# Patient Record
Sex: Female | Born: 1945
Health system: Southern US, Community
[De-identification: ages and names within clinical notes are randomized; demographics above are authoritative.]

## PROBLEM LIST (undated history)

## (undated) DIAGNOSIS — K2270 Barrett's esophagus without dysplasia: Secondary | ICD-10-CM

## (undated) DIAGNOSIS — Z8614 Personal history of Methicillin resistant Staphylococcus aureus infection: Secondary | ICD-10-CM

## (undated) DIAGNOSIS — R42 Dizziness and giddiness: Secondary | ICD-10-CM

## (undated) DIAGNOSIS — J329 Chronic sinusitis, unspecified: Secondary | ICD-10-CM

## (undated) DIAGNOSIS — D369 Benign neoplasm, unspecified site: Secondary | ICD-10-CM

## (undated) DIAGNOSIS — G43909 Migraine, unspecified, not intractable, without status migrainosus: Secondary | ICD-10-CM

## (undated) DIAGNOSIS — M5134 Other intervertebral disc degeneration, thoracic region: Secondary | ICD-10-CM

## (undated) DIAGNOSIS — H9190 Unspecified hearing loss, unspecified ear: Secondary | ICD-10-CM

## (undated) DIAGNOSIS — M542 Cervicalgia: Secondary | ICD-10-CM

## (undated) DIAGNOSIS — S81801A Unspecified open wound, right lower leg, initial encounter: Secondary | ICD-10-CM

## (undated) DIAGNOSIS — M51369 Other intervertebral disc degeneration, lumbar region without mention of lumbar back pain or lower extremity pain: Secondary | ICD-10-CM

## (undated) DIAGNOSIS — I10 Essential (primary) hypertension: Secondary | ICD-10-CM

## (undated) DIAGNOSIS — E7800 Pure hypercholesterolemia, unspecified: Secondary | ICD-10-CM

## (undated) DIAGNOSIS — M6530 Trigger finger, unspecified finger: Secondary | ICD-10-CM

## (undated) DIAGNOSIS — H9319 Tinnitus, unspecified ear: Secondary | ICD-10-CM

## (undated) DIAGNOSIS — E785 Hyperlipidemia, unspecified: Secondary | ICD-10-CM

## (undated) DIAGNOSIS — K219 Gastro-esophageal reflux disease without esophagitis: Secondary | ICD-10-CM

## (undated) DIAGNOSIS — I8393 Asymptomatic varicose veins of bilateral lower extremities: Secondary | ICD-10-CM

## (undated) HISTORY — DX: Gastro-esophageal reflux disease without esophagitis: K21.9

## (undated) HISTORY — DX: Cervicalgia: M54.2

## (undated) HISTORY — DX: Trigger finger, unspecified finger: M65.30

## (undated) HISTORY — DX: Other intervertebral disc degeneration, thoracic region: M51.34

## (undated) HISTORY — DX: Tinnitus, unspecified ear: H93.19

## (undated) HISTORY — DX: Migraine, unspecified, not intractable, without status migrainosus: G43.909

## (undated) HISTORY — DX: Benign neoplasm, unspecified site: D36.9

## (undated) HISTORY — PX: FINGER SURGERY: SHX640

## (undated) HISTORY — PX: NECK SURGERY: SHX720

## (undated) HISTORY — DX: Pure hypercholesterolemia, unspecified: E78.00

## (undated) HISTORY — DX: Essential (primary) hypertension: I10

## (undated) HISTORY — DX: Hyperlipidemia, unspecified: E78.5

## (undated) HISTORY — DX: Asymptomatic varicose veins of bilateral lower extremities: I83.93

## (undated) HISTORY — DX: Unspecified open wound, right lower leg, initial encounter: S81.801A

## (undated) HISTORY — PX: LAMINECTOMY: SHX219

## (undated) HISTORY — DX: Chronic sinusitis, unspecified: J32.9

## (undated) HISTORY — DX: Personal history of Methicillin resistant Staphylococcus aureus infection: Z86.14

## (undated) HISTORY — PX: APPENDECTOMY: SHX54

## (undated) HISTORY — PX: NASAL SINUS SURGERY: SHX719

## (undated) HISTORY — PX: MYRINGOTOMY: SHX2060

## (undated) HISTORY — DX: Other intervertebral disc degeneration, lumbar region without mention of lumbar back pain or lower extremity pain: M51.369

## (undated) HISTORY — DX: Unspecified hearing loss, unspecified ear: H91.90

## (undated) HISTORY — PX: TRIGGER FINGER RELEASE: SHX641

## (undated) HISTORY — DX: Dizziness and giddiness: R42

## (undated) HISTORY — DX: Barrett's esophagus without dysplasia: K22.70

## (undated) HISTORY — PX: ABDOMINAL HYSTERECTOMY: SHX81

---

## 1998-01-28 ENCOUNTER — Ambulatory Visit (HOSPITAL_COMMUNITY): Admission: RE | Admit: 1998-01-28 | Discharge: 1998-01-28 | Payer: Self-pay | Admitting: Neurosurgery

## 1998-08-21 ENCOUNTER — Other Ambulatory Visit: Admission: RE | Admit: 1998-08-21 | Discharge: 1998-08-21 | Payer: Self-pay | Admitting: Obstetrics and Gynecology

## 1999-09-20 ENCOUNTER — Other Ambulatory Visit: Admission: RE | Admit: 1999-09-20 | Discharge: 1999-09-20 | Payer: Self-pay | Admitting: Obstetrics and Gynecology

## 2001-10-01 ENCOUNTER — Encounter: Payer: Self-pay | Admitting: General Surgery

## 2001-10-01 ENCOUNTER — Ambulatory Visit (HOSPITAL_COMMUNITY): Admission: RE | Admit: 2001-10-01 | Discharge: 2001-10-01 | Payer: Self-pay | Admitting: General Surgery

## 2003-12-29 ENCOUNTER — Encounter (INDEPENDENT_AMBULATORY_CARE_PROVIDER_SITE_OTHER): Payer: Self-pay | Admitting: *Deleted

## 2003-12-29 ENCOUNTER — Ambulatory Visit (HOSPITAL_BASED_OUTPATIENT_CLINIC_OR_DEPARTMENT_OTHER): Admission: RE | Admit: 2003-12-29 | Discharge: 2003-12-29 | Payer: Self-pay | Admitting: *Deleted

## 2003-12-29 ENCOUNTER — Ambulatory Visit (HOSPITAL_COMMUNITY): Admission: RE | Admit: 2003-12-29 | Discharge: 2003-12-29 | Payer: Self-pay | Admitting: *Deleted

## 2009-10-20 ENCOUNTER — Encounter: Admission: RE | Admit: 2009-10-20 | Discharge: 2009-10-20 | Payer: Self-pay | Admitting: Family Medicine

## 2010-09-22 ENCOUNTER — Other Ambulatory Visit: Payer: Self-pay | Admitting: Family Medicine

## 2010-09-22 DIAGNOSIS — Z1231 Encounter for screening mammogram for malignant neoplasm of breast: Secondary | ICD-10-CM

## 2010-10-08 NOTE — Op Note (Signed)
[facility]  Patient:    Nicole Herrera, Nicole Herrera Visit Number: [id number] MRN: [id number]          Service Type: DSU Location: DAY Attending Physician:  Carson Myrtle Dictated by:   Sheppard Plumber Earlene Plater, M.D. Proc. Date: 10/01/01 Admit Date:  10/01/2001   CC:         Stacie Acres. Cliffton Asters, M.D.   Operative Report  PREOPERATIVE DIAGNOSIS:  Anal fissure.  POSTOPERATIVE DIAGNOSIS:  Anal fissure.  PROCEDURE:  Repair of anal fissure.  SURGEON:  Timothy E. Earlene Plater, M.D.  ANESTHESIA:  General.  INDICATION:  [name] has failed conservative management for the past several months for treatment of anal fissure and has elected to proceed with surgical repair and has been carefully discussed and counseled in the office.  Her laboratory data and EKG are normal.  DESCRIPTION OF PROCEDURE:  She was evaluated by anesthesia, taken to the operating room, placed supine, LMA anesthesia provided.  Perianal area inspected and prepped and draped in the usual fashion.  The anus was small, moderately stenotic, had posterior anal fissure, and two tiny skin tags were present.  The anus was gently dilated.  Rectal tags could be felt as well internally.  The anus was injected around and about with Marcaine 0.5% with epinephrine mixed 9:1 with Wydase and gently massaged in.  A left posterior internal sphincterotomy accomplished percutaneously with a 15 blade.  The external sphincter remained intact.  Gentle dilatation allowed insertion of the adult operating anoscope.  The anal papillae internally were cauterized. The fissure base was cauterized, and the two external tags were cauterized. This completed the procedure.  There were no other findings.  The patient tolerated it well.  Gelfoam, gauze, and a dry sterile dressing applied.  She was removed to the recovery room in good condition.  Written and verbal instructions, including Vicodin #36, 1 refill, and she will seen and  followed as an outpatient. Dictated by:   Sheppard Plumber Earlene Plater, M.D. Attending Physician:  Carson Myrtle DD:  10/01/01 TD:  10/01/01 Job: [id number]

## 2010-10-08 NOTE — Op Note (Signed)
NAME:  ORALEE, RAPAPORT                           ACCOUNT NO.:  [id number]   MEDICAL RECORD NO.:  [id number]                   PATIENT TYPE:  AMB   LOCATION:  DSC                                  FACILITY:  [facility]   PHYSICIAN:  Kathy Breach, M.D.                   DATE OF BIRTH:  Dec 14, 1945   DATE OF PROCEDURE:  12/29/2003  DATE OF DISCHARGE:                                 OPERATIVE REPORT   PREOPERATIVE DIAGNOSIS:  Recurrent nasal/sinus polyps.   PROCEDURE:  Revision endoscopic nasal sinus polypectomy including:  1. Completion of anterior ethmoidectomy and frontalotomy, right side.  2. Completion of posterior ethmoidectomy and sphenoidotomy, left side.   POSTOPERATIVE DIAGNOSIS:  Recurrent nasal/sinus polyps.   SURGEON:  Kathy Breach, M.D.   DESCRIPTION OF PROCEDURE:  With the patient under general orotracheal  anesthesia, a nasal block was applied with 4% Xylocaine with Ephedrine  solution soaked cotton tipped probes to the sphenopalatine and anterior  ethmoid nerve areas bilaterally.  Cotton pledgets soaked in a similar  solution were inserted along the middle and inferior turbinate areas  bilaterally.  The middle meatal mucosa was infiltrated with 1% Xylocaine  1:000,000 epinephrine for further vasoconstrictive effort.  On inspection on  the left side, the patient had small polypoid disease in the remaining  ethmoidectomy surgical site with a sizeable polyp hanging over to the medial  side to the posterior extent of the middle turbinate which was quite  hyperplastic and this was consistent with the CAT scan findings showing  significant posterior ethmoid disease and polypoid degeneration of the  anterior sphenoid sinus.  On the right side, she had a sizeable mucoid polyp  coming from the middle meatal area fairly anteriorly.  Small polyps in the  previous posterior ethmoidectomy site on the right side persist and the  anterior ethmoid cells again consistent with the findings on the  scan.  Under endoscopic visualization using the suction debrider primarily,  initially on the left side, I also encountered a couple of synechial bands  from the prominent middle turbinate to the lateral middle meatal wall.  These were broken down along with taking out all of the small polyps. The  large osteomeatal window area was opened and clear and the mucosa of the  left maxillary sinus was normal in appearance.  Extending more posteriorly,  the posterior ethmoid cells were taken down and filled with polypoid  disease.  The prominent middle turbinate was displaced laterally.  A large  polyp posteriorly between the posterior aspect of the turbinate and septum  was taken down and cleaned off down to the sphenoid rostrum.  The sphenoid  ostium area was cleared and opened and polypoid disease suctioned from and  removed with the suction debrider from the left sphenoid sinus.  Minimal  bleeding occurred.  Packs were inserted for vasoconstriction.  Attention was  then turned  to the right side and again mainly using the suction debrider a  large polyp presenting from the middle meatus anteriorly was removed and  encountering other polyps in the previous surgical site with the anterior  ethmoid cells remaining intact and clear posteriorly.  There was minimal  remaining middle turbinate on the right side with the entire intact inferior  turbinate as opposed to the left side with a large middle turbinate  remaining and nearly no remaining inferior turbinate.  Using hot biting and  straight forward Wilde forceps, the anterior ethmoid cells were removed and  curved suction could then be run through the frontal recess area that was  now cleaned out into the frontal sinus which had polypoid disease on the CAT  scan.  Again, minimal bleeding was present. All packs were removed and with  visualization  there was minimal oozing.  This is all that persisted.  The nasopharynx and  oral cavity were  suctioned clear.  Total blood loss for the procedure was  estimated at 50 mL or less.  The patient tolerated the procedure well and  was taken to the recovery room in stable general condition.                                               Kathy Breach, M.D.    Venia Minks  D:  12/29/2003  T:  12/29/2003  Job:  [id number]

## 2010-10-26 ENCOUNTER — Ambulatory Visit
Admission: RE | Admit: 2010-10-26 | Discharge: 2010-10-26 | Disposition: A | Discharge status: Home / Self Care | Payer: Self-pay | Source: Ambulatory Visit | Attending: Family Medicine | Admitting: Family Medicine

## 2010-10-26 DIAGNOSIS — Z1231 Encounter for screening mammogram for malignant neoplasm of breast: Secondary | ICD-10-CM

## 2010-10-29 ENCOUNTER — Other Ambulatory Visit: Payer: Self-pay | Admitting: Family Medicine

## 2010-10-29 DIAGNOSIS — R928 Other abnormal and inconclusive findings on diagnostic imaging of breast: Secondary | ICD-10-CM

## 2010-11-05 ENCOUNTER — Ambulatory Visit
Admission: RE | Admit: 2010-11-05 | Discharge: 2010-11-05 | Disposition: A | Discharge status: Home / Self Care | Payer: BC Managed Care – PPO | Source: Ambulatory Visit | Attending: Family Medicine | Admitting: Family Medicine

## 2010-11-05 DIAGNOSIS — R928 Other abnormal and inconclusive findings on diagnostic imaging of breast: Secondary | ICD-10-CM

## 2011-11-03 ENCOUNTER — Other Ambulatory Visit: Payer: Self-pay | Admitting: Family Medicine

## 2011-11-03 DIAGNOSIS — Z1231 Encounter for screening mammogram for malignant neoplasm of breast: Secondary | ICD-10-CM

## 2011-11-07 ENCOUNTER — Ambulatory Visit
Admission: RE | Admit: 2011-11-07 | Discharge: 2011-11-07 | Disposition: A | Discharge status: Home / Self Care | Payer: BC Managed Care – PPO | Source: Ambulatory Visit | Attending: Family Medicine | Admitting: Family Medicine

## 2011-11-07 DIAGNOSIS — Z1231 Encounter for screening mammogram for malignant neoplasm of breast: Secondary | ICD-10-CM

## 2012-11-26 ENCOUNTER — Other Ambulatory Visit: Payer: Self-pay | Admitting: Family Medicine

## 2012-11-26 DIAGNOSIS — Z1231 Encounter for screening mammogram for malignant neoplasm of breast: Secondary | ICD-10-CM

## 2013-01-10 ENCOUNTER — Ambulatory Visit: Payer: BC Managed Care – PPO

## 2013-01-15 ENCOUNTER — Ambulatory Visit: Payer: BC Managed Care – PPO

## 2013-01-17 ENCOUNTER — Ambulatory Visit: Payer: BC Managed Care – PPO

## 2013-02-05 ENCOUNTER — Ambulatory Visit (INDEPENDENT_AMBULATORY_CARE_PROVIDER_SITE_OTHER): Payer: BC Managed Care – PPO

## 2013-02-05 DIAGNOSIS — Z1231 Encounter for screening mammogram for malignant neoplasm of breast: Secondary | ICD-10-CM

## 2013-12-31 ENCOUNTER — Other Ambulatory Visit: Payer: Self-pay | Admitting: Family Medicine

## 2013-12-31 DIAGNOSIS — R110 Nausea: Secondary | ICD-10-CM

## 2014-01-03 ENCOUNTER — Other Ambulatory Visit: Payer: BC Managed Care – PPO

## 2014-05-02 ENCOUNTER — Other Ambulatory Visit: Payer: Self-pay | Admitting: Family Medicine

## 2014-05-02 DIAGNOSIS — Z1231 Encounter for screening mammogram for malignant neoplasm of breast: Secondary | ICD-10-CM

## 2014-05-08 ENCOUNTER — Ambulatory Visit (INDEPENDENT_AMBULATORY_CARE_PROVIDER_SITE_OTHER): Payer: BC Managed Care – PPO

## 2014-05-08 DIAGNOSIS — Z1231 Encounter for screening mammogram for malignant neoplasm of breast: Secondary | ICD-10-CM

## 2014-06-19 ENCOUNTER — Other Ambulatory Visit: Payer: Self-pay | Admitting: Gastroenterology

## 2014-06-19 DIAGNOSIS — R1013 Epigastric pain: Secondary | ICD-10-CM

## 2014-06-27 ENCOUNTER — Ambulatory Visit
Admission: RE | Admit: 2014-06-27 | Discharge: 2014-06-27 | Disposition: A | Discharge status: Home / Self Care | Payer: BC Managed Care – PPO | Source: Ambulatory Visit | Attending: Gastroenterology | Admitting: Gastroenterology

## 2014-06-27 DIAGNOSIS — R1013 Epigastric pain: Secondary | ICD-10-CM

## 2014-09-01 ENCOUNTER — Other Ambulatory Visit: Payer: Self-pay | Admitting: Gastroenterology

## 2014-09-01 DIAGNOSIS — R102 Pelvic and perineal pain: Secondary | ICD-10-CM

## 2014-09-01 DIAGNOSIS — R1084 Generalized abdominal pain: Secondary | ICD-10-CM

## 2014-09-03 ENCOUNTER — Other Ambulatory Visit: Payer: BC Managed Care – PPO

## 2014-09-05 ENCOUNTER — Ambulatory Visit
Admission: RE | Admit: 2014-09-05 | Discharge: 2014-09-05 | Disposition: A | Discharge status: Home / Self Care | Payer: BC Managed Care – PPO | Source: Ambulatory Visit | Attending: Gastroenterology | Admitting: Gastroenterology

## 2014-09-05 DIAGNOSIS — R1084 Generalized abdominal pain: Secondary | ICD-10-CM

## 2014-09-05 DIAGNOSIS — R102 Pelvic and perineal pain: Secondary | ICD-10-CM

## 2014-09-05 MED ORDER — IOPAMIDOL (ISOVUE-300) INJECTION 61%
125.0000 mL | Freq: Once | INTRAVENOUS | Status: AC | PRN
  Administered 2014-09-05: 125 mL via INTRAVENOUS

## 2014-12-01 DIAGNOSIS — R109 Unspecified abdominal pain: Secondary | ICD-10-CM | POA: Diagnosis not present

## 2014-12-01 DIAGNOSIS — I10 Essential (primary) hypertension: Secondary | ICD-10-CM | POA: Diagnosis not present

## 2014-12-05 DIAGNOSIS — M65342 Trigger finger, left ring finger: Secondary | ICD-10-CM | POA: Diagnosis not present

## 2015-01-08 DIAGNOSIS — M79673 Pain in unspecified foot: Secondary | ICD-10-CM | POA: Diagnosis not present

## 2015-01-15 DIAGNOSIS — Z833 Family history of diabetes mellitus: Secondary | ICD-10-CM | POA: Diagnosis not present

## 2015-01-15 DIAGNOSIS — R635 Abnormal weight gain: Secondary | ICD-10-CM | POA: Diagnosis not present

## 2015-02-02 DIAGNOSIS — H40013 Open angle with borderline findings, low risk, bilateral: Secondary | ICD-10-CM | POA: Diagnosis not present

## 2015-02-02 DIAGNOSIS — G43909 Migraine, unspecified, not intractable, without status migrainosus: Secondary | ICD-10-CM | POA: Diagnosis not present

## 2015-02-07 DIAGNOSIS — S81812A Laceration without foreign body, left lower leg, initial encounter: Secondary | ICD-10-CM | POA: Diagnosis not present

## 2015-02-18 DIAGNOSIS — H9202 Otalgia, left ear: Secondary | ICD-10-CM | POA: Diagnosis not present

## 2015-02-18 DIAGNOSIS — S81812A Laceration without foreign body, left lower leg, initial encounter: Secondary | ICD-10-CM | POA: Diagnosis not present

## 2015-02-27 DIAGNOSIS — M65342 Trigger finger, left ring finger: Secondary | ICD-10-CM | POA: Diagnosis not present

## 2015-04-02 ENCOUNTER — Other Ambulatory Visit: Payer: Self-pay | Admitting: Family Medicine

## 2015-04-02 DIAGNOSIS — Z1231 Encounter for screening mammogram for malignant neoplasm of breast: Secondary | ICD-10-CM

## 2015-05-13 ENCOUNTER — Ambulatory Visit: Payer: BC Managed Care – PPO

## 2015-06-01 DIAGNOSIS — J309 Allergic rhinitis, unspecified: Secondary | ICD-10-CM | POA: Diagnosis not present

## 2015-06-01 DIAGNOSIS — J329 Chronic sinusitis, unspecified: Secondary | ICD-10-CM | POA: Diagnosis not present

## 2015-06-04 DIAGNOSIS — J309 Allergic rhinitis, unspecified: Secondary | ICD-10-CM | POA: Diagnosis not present

## 2015-06-04 DIAGNOSIS — E785 Hyperlipidemia, unspecified: Secondary | ICD-10-CM | POA: Diagnosis not present

## 2015-06-04 DIAGNOSIS — Z6835 Body mass index (BMI) 35.0-35.9, adult: Secondary | ICD-10-CM | POA: Diagnosis not present

## 2015-06-04 DIAGNOSIS — E6609 Other obesity due to excess calories: Secondary | ICD-10-CM | POA: Diagnosis not present

## 2015-06-04 DIAGNOSIS — K219 Gastro-esophageal reflux disease without esophagitis: Secondary | ICD-10-CM | POA: Diagnosis not present

## 2015-06-04 DIAGNOSIS — I10 Essential (primary) hypertension: Secondary | ICD-10-CM | POA: Diagnosis not present

## 2015-06-05 DIAGNOSIS — M65311 Trigger thumb, right thumb: Secondary | ICD-10-CM | POA: Diagnosis not present

## 2015-06-05 DIAGNOSIS — M65342 Trigger finger, left ring finger: Secondary | ICD-10-CM | POA: Diagnosis not present

## 2015-06-11 ENCOUNTER — Ambulatory Visit (INDEPENDENT_AMBULATORY_CARE_PROVIDER_SITE_OTHER): Payer: Medicare PPO

## 2015-06-11 DIAGNOSIS — Z1231 Encounter for screening mammogram for malignant neoplasm of breast: Secondary | ICD-10-CM | POA: Diagnosis not present

## 2015-07-03 DIAGNOSIS — J310 Chronic rhinitis: Secondary | ICD-10-CM | POA: Diagnosis not present

## 2015-07-03 DIAGNOSIS — J329 Chronic sinusitis, unspecified: Secondary | ICD-10-CM | POA: Diagnosis not present

## 2015-07-06 DIAGNOSIS — M65342 Trigger finger, left ring finger: Secondary | ICD-10-CM | POA: Diagnosis not present

## 2015-07-06 DIAGNOSIS — M79645 Pain in left finger(s): Secondary | ICD-10-CM | POA: Diagnosis not present

## 2015-07-06 DIAGNOSIS — M65312 Trigger thumb, left thumb: Secondary | ICD-10-CM | POA: Diagnosis not present

## 2015-07-07 DIAGNOSIS — J324 Chronic pansinusitis: Secondary | ICD-10-CM | POA: Diagnosis not present

## 2015-07-07 DIAGNOSIS — J329 Chronic sinusitis, unspecified: Secondary | ICD-10-CM | POA: Diagnosis not present

## 2015-09-18 DIAGNOSIS — M65342 Trigger finger, left ring finger: Secondary | ICD-10-CM | POA: Diagnosis not present

## 2015-09-18 DIAGNOSIS — M65312 Trigger thumb, left thumb: Secondary | ICD-10-CM | POA: Diagnosis not present

## 2015-09-18 DIAGNOSIS — M65311 Trigger thumb, right thumb: Secondary | ICD-10-CM | POA: Diagnosis not present

## 2015-10-29 DIAGNOSIS — M65342 Trigger finger, left ring finger: Secondary | ICD-10-CM | POA: Diagnosis not present

## 2015-11-04 DIAGNOSIS — J309 Allergic rhinitis, unspecified: Secondary | ICD-10-CM | POA: Diagnosis not present

## 2015-11-04 DIAGNOSIS — R42 Dizziness and giddiness: Secondary | ICD-10-CM | POA: Diagnosis not present

## 2015-11-06 DIAGNOSIS — R42 Dizziness and giddiness: Secondary | ICD-10-CM | POA: Diagnosis not present

## 2015-11-12 DIAGNOSIS — M65342 Trigger finger, left ring finger: Secondary | ICD-10-CM | POA: Diagnosis not present

## 2015-11-18 DIAGNOSIS — H9042 Sensorineural hearing loss, unilateral, left ear, with unrestricted hearing on the contralateral side: Secondary | ICD-10-CM | POA: Diagnosis not present

## 2015-11-18 DIAGNOSIS — R42 Dizziness and giddiness: Secondary | ICD-10-CM | POA: Diagnosis not present

## 2015-11-18 DIAGNOSIS — H9312 Tinnitus, left ear: Secondary | ICD-10-CM | POA: Diagnosis not present

## 2015-12-03 DIAGNOSIS — J309 Allergic rhinitis, unspecified: Secondary | ICD-10-CM | POA: Diagnosis not present

## 2015-12-03 DIAGNOSIS — Z0000 Encounter for general adult medical examination without abnormal findings: Secondary | ICD-10-CM | POA: Diagnosis not present

## 2015-12-03 DIAGNOSIS — E785 Hyperlipidemia, unspecified: Secondary | ICD-10-CM | POA: Diagnosis not present

## 2015-12-03 DIAGNOSIS — K219 Gastro-esophageal reflux disease without esophagitis: Secondary | ICD-10-CM | POA: Diagnosis not present

## 2015-12-03 DIAGNOSIS — N951 Menopausal and female climacteric states: Secondary | ICD-10-CM | POA: Diagnosis not present

## 2015-12-03 DIAGNOSIS — I10 Essential (primary) hypertension: Secondary | ICD-10-CM | POA: Diagnosis not present

## 2015-12-08 DIAGNOSIS — N182 Chronic kidney disease, stage 2 (mild): Secondary | ICD-10-CM | POA: Diagnosis not present

## 2015-12-08 DIAGNOSIS — E785 Hyperlipidemia, unspecified: Secondary | ICD-10-CM | POA: Diagnosis not present

## 2015-12-08 DIAGNOSIS — Z87891 Personal history of nicotine dependence: Secondary | ICD-10-CM | POA: Diagnosis not present

## 2015-12-08 DIAGNOSIS — I129 Hypertensive chronic kidney disease with stage 1 through stage 4 chronic kidney disease, or unspecified chronic kidney disease: Secondary | ICD-10-CM | POA: Diagnosis not present

## 2015-12-08 DIAGNOSIS — A46 Erysipelas: Secondary | ICD-10-CM | POA: Diagnosis not present

## 2015-12-08 DIAGNOSIS — K219 Gastro-esophageal reflux disease without esophagitis: Secondary | ICD-10-CM | POA: Diagnosis not present

## 2015-12-08 DIAGNOSIS — H9190 Unspecified hearing loss, unspecified ear: Secondary | ICD-10-CM | POA: Diagnosis not present

## 2015-12-08 DIAGNOSIS — Z6834 Body mass index (BMI) 34.0-34.9, adult: Secondary | ICD-10-CM | POA: Diagnosis not present

## 2015-12-08 DIAGNOSIS — K2270 Barrett's esophagus without dysplasia: Secondary | ICD-10-CM | POA: Diagnosis not present

## 2015-12-18 DIAGNOSIS — M65311 Trigger thumb, right thumb: Secondary | ICD-10-CM | POA: Diagnosis not present

## 2015-12-25 DIAGNOSIS — H903 Sensorineural hearing loss, bilateral: Secondary | ICD-10-CM | POA: Diagnosis not present

## 2015-12-25 DIAGNOSIS — H9312 Tinnitus, left ear: Secondary | ICD-10-CM | POA: Diagnosis not present

## 2015-12-25 DIAGNOSIS — R42 Dizziness and giddiness: Secondary | ICD-10-CM | POA: Diagnosis not present

## 2016-01-08 DIAGNOSIS — E785 Hyperlipidemia, unspecified: Secondary | ICD-10-CM | POA: Diagnosis not present

## 2016-01-08 DIAGNOSIS — I10 Essential (primary) hypertension: Secondary | ICD-10-CM | POA: Diagnosis not present

## 2016-01-13 DIAGNOSIS — Z780 Asymptomatic menopausal state: Secondary | ICD-10-CM | POA: Diagnosis not present

## 2016-02-25 DIAGNOSIS — M79645 Pain in left finger(s): Secondary | ICD-10-CM | POA: Diagnosis not present

## 2016-02-25 DIAGNOSIS — Z4789 Encounter for other orthopedic aftercare: Secondary | ICD-10-CM | POA: Diagnosis not present

## 2016-02-25 DIAGNOSIS — M65312 Trigger thumb, left thumb: Secondary | ICD-10-CM | POA: Diagnosis not present

## 2016-02-25 DIAGNOSIS — M65311 Trigger thumb, right thumb: Secondary | ICD-10-CM | POA: Diagnosis not present

## 2016-03-10 ENCOUNTER — Ambulatory Visit: Payer: Medicare PPO | Attending: Family Medicine | Admitting: Physical Therapy

## 2016-03-10 DIAGNOSIS — R2689 Other abnormalities of gait and mobility: Secondary | ICD-10-CM | POA: Diagnosis not present

## 2016-03-10 DIAGNOSIS — R42 Dizziness and giddiness: Secondary | ICD-10-CM | POA: Diagnosis not present

## 2016-03-10 NOTE — Therapy (Signed)
[facility] [address] [place], [place], [address] Phone: [phone]   Fax:  [phone]  Physical Therapy Evaluation  Patient Details  Name: Nicole Herrera MRN: [id number] Date of Birth: Jun 10, 1945 Referring Provider: Dr. Harlan Stains  Encounter Date: 03/10/2016      PT End of Session - 03/10/16 2023    Visit Number 1   Number of Visits 8   Date for PT Re-Evaluation 04/10/16   Authorization Type [employer]   Authorization Time Period 03-10-16 - 05-09-16   PT Start Time 0850   PT Stop Time 0931   PT Time Calculation (min) 41 min      No past medical history on file.  No past surgical history on file.  There were no vitals filed for this visit.       Subjective Assessment - 03/10/16 2010    Subjective Pt reports "this started back in June"; started out with dizziness, pressure in the ears, leaning over slowly and started limiting mobility: pt saw Dr. Wilburn Cornelia in July - diagnosed with some hearing loss in low tones   Pertinent History neck surgery 1998 (disc herniations with fusion): h/o migraines   Patient Stated Goals resolve the vertigo   Currently in Pain? No/denies            [facility] PT Assessment - 03/10/16 [id number]      Assessment   Medical Diagnosis Vertigo   Referring Provider Dr. Harlan Stains   Onset Date/Surgical Date --  June 2017     Precautions   Precautions Other (comment)  Vertigo     Balance Screen   Has the patient fallen in the past 6 months No   Has the patient had a decrease in activity level because of a fear of falling?  No   Is the patient reluctant to leave their home because of a fear of falling?  No     Prior Function   Level of Independence Independent     Ambulation/Gait   Ambulation/Gait Yes   Assistive device None   Gait Pattern Within Functional Limits   Ambulation Surface Level;Indoor            Vestibular Assessment - 03/10/16 0907      Vestibular Assessment   General Observation Pt reports a feeling of being void, unable to fully process     Symptom Behavior   Type of Dizziness "Funny feeling in head"  imbalance, unsteady with head/body turns   Frequency of Dizziness daily for past 3 weeks   Aggravating Factors Activity in general;Forward bending;Comment  Reading     Occulomotor Exam   Occulomotor Alignment Normal   Smooth Pursuits Saccades   Saccades Comment  some mild nystagmus noted with smooth pursuit testing horizo     Positional Testing   Dix-Hallpike Dix-Hallpike Right;Dix-Hallpike Left   Sidelying Test Sidelying Right;Sidelying Left     Dix-Hallpike Right   Dix-Hallpike Right Duration none   Dix-Hallpike Right Symptoms No nystagmus     Dix-Hallpike Left   Dix-Hallpike Left Duration none   Dix-Hallpike Left Symptoms No nystagmus     Sidelying Right   Sidelying Right Duration none   Sidelying Right Symptoms No nystagmus     Sidelying Left   Sidelying Left Duration approx. 10 secs   Sidelying Left Symptoms Other (comment)  difficult to determine direction  PT Long Term Goals - 03/10/16 2053      PT LONG TERM GOAL #1   Title Pt will report at least 50% improvement in vertigo.  (04-10-16)   Time 4   Period Weeks   Status New     PT LONG TERM GOAL #2   Title Independent in HEP for vestibular and balance exercises.  (04-10-16)   Time 4   Period Weeks   Status New     PT LONG TERM GOAL #3   Title Perform SOT and establish goal as appropriate.  (04-10-16)   Time 4   Period Weeks   Status New               Plan - 03/10/16 2037    Clinical Impression Statement Pt is a 70 year old lady with c/o vertigo that initially started in June 2017 with dizziness, tinnitus and pressure in head; pt reports she saw Dr. Wilburn Cornelia in July who did a hearing test and had a repeat hearing test done again in August at her request, which showed no signficant  changes in hearing after a month.  Pt presents with vertigo of unknown etiology. Described symptoms appear to be consistent with BPPV, however, no rotary nystagmus observed during positional testing.  Some symptoms possibly consistent with cervicogenic vertigo.  Not all symptoms consistent with Meniere's disease as pt does not have documented hearing loss and reports no episodes of severe nausea or vomiting.  PMH includes HTN and allergic rhinitis.  Pt presents with gait deficits due to c/o vertigo.                                                                                                                                                       Rehab Potential Good   PT Frequency 2x / week   PT Duration 4 weeks   PT Treatment/Interventions Vestibular;Therapeutic activities;Therapeutic exercise;Balance training;Neuromuscular re-education;Patient/family education;ADLs/Self Care Home Management;Canalith Repostioning   PT Next Visit Plan recheck positional testing; DVA:  SOT   PT Home Exercise Plan to be determined   Consulted and Agree with Plan of Care Patient      Patient will benefit from skilled therapeutic intervention in order to improve the following deficits and impairments:  Difficulty walking, Decreased balance, Dizziness  Visit Diagnosis: Dizziness and giddiness - Plan: PT plan of care cert/re-cert  Other abnormalities of gait and mobility - Plan: PT plan of care cert/re-cert      G-Codes - [id number]    Functional Assessment Tool Used c/o dizziness with L sidelying to sitting and with looking down   Functional Limitation Changing and maintaining body position   Changing and Maintaining Body Position Current Status [id number]) At least 60 percent but less than 80 percent impaired, limited or restricted   Changing and Maintaining Body Position Goal  Status [phone]) At least 20 percent but less than 40 percent impaired, limited or restricted       Problem List There are no active  problems to display for this patient.   Alda Lea, PT 03/10/2016, 9:06 PM  [facility] [address] [place], [place], [address] Phone: [phone]   Fax:  [phone]  Name: Nicole Herrera MRN: [id number] Date of Birth: 1945-09-17

## 2016-03-10 NOTE — Patient Instructions (Signed)
Cervical stretches and ROM  Mulligan's stretch with towel

## 2016-03-11 ENCOUNTER — Ambulatory Visit: Payer: Medicare PPO | Admitting: Physical Therapy

## 2016-03-11 DIAGNOSIS — R2689 Other abnormalities of gait and mobility: Secondary | ICD-10-CM | POA: Diagnosis not present

## 2016-03-11 DIAGNOSIS — R42 Dizziness and giddiness: Secondary | ICD-10-CM

## 2016-03-11 NOTE — Patient Instructions (Signed)
Benign Positional Vertigo Vertigo is the feeling that you or your surroundings are moving when they are not. Benign positional vertigo is the most common form of vertigo. The cause of this condition is not serious (is benign). This condition is triggered by certain movements and positions (is positional). This condition can be dangerous if it occurs while you are doing something that could endanger you or others, such as driving.  CAUSES In many cases, the cause of this condition is not known. It may be caused by a disturbance in an area of the inner ear that helps your brain to sense movement and balance. This disturbance can be caused by a viral infection (labyrinthitis), head injury, or repetitive motion. RISK FACTORS This condition is more likely to develop in:  Women.  People who are 50 years of age or older. SYMPTOMS Symptoms of this condition usually happen when you move your head or your eyes in different directions. Symptoms may start suddenly, and they usually last for less than a minute. Symptoms may include:  Loss of balance and falling.  Feeling like you are spinning or moving.  Feeling like your surroundings are spinning or moving.  Nausea and vomiting.  Blurred vision.  Dizziness.  Involuntary eye movement (nystagmus). Symptoms can be mild and cause only slight annoyance, or they can be severe and interfere with daily life. Episodes of benign positional vertigo may return (recur) over time, and they may be triggered by certain movements. Symptoms may improve over time. DIAGNOSIS This condition is usually diagnosed by medical history and a physical exam of the head, neck, and ears. You may be referred to a health care provider who specializes in ear, nose, and throat (ENT) problems (otolaryngologist) or a provider who specializes in disorders of the nervous system (neurologist). You may have additional testing, including:  MRI.  A CT scan.  Eye movement tests. Your  health care provider may ask you to change positions quickly while he or she watches you for symptoms of benign positional vertigo, such as nystagmus. Eye movement may be tested with an electronystagmogram (ENG), caloric stimulation, the Dix-Hallpike test, or the roll test.  An electroencephalogram (EEG). This records electrical activity in your brain.  Hearing tests. TREATMENT Usually, your health care provider will treat this by moving your head in specific positions to adjust your inner ear back to normal. Surgery may be needed in severe cases, but this is rare. In some cases, benign positional vertigo may resolve on its own in 2-4 weeks. HOME CARE INSTRUCTIONS Safety  Move slowly.Avoid sudden body or head movements.  Avoid driving.  Avoid operating heavy machinery.  Avoid doing any tasks that would be dangerous to you or others if a vertigo episode would occur.  If you have trouble walking or keeping your balance, try using a cane for stability. If you feel dizzy or unstable, sit down right away.  Return to your normal activities as told by your health care provider. Ask your health care provider what activities are safe for you. General Instructions  Take over-the-counter and prescription medicines only as told by your health care provider.  Avoid certain positions or movements as told by your health care provider.  Drink enough fluid to keep your urine clear or pale yellow.  Keep all follow-up visits as told by your health care provider. This is important. SEEK MEDICAL CARE IF:  You have a fever.  Your condition gets worse or you develop new symptoms.  Your family or friends   notice any behavioral changes.  Your nausea or vomiting gets worse.  You have numbness or a "pins and needles" sensation. SEEK IMMEDIATE MEDICAL CARE IF:  You have difficulty speaking or moving.  You are always dizzy.  You faint.  You develop severe headaches.  You have weakness in your  legs or arms.  You have changes in your hearing or vision.  You develop a stiff neck.  You develop sensitivity to light.   This information is not intended to replace advice given to you by your health care provider. Make sure you discuss any questions you have with your health care provider.   Document Released: 02/14/2006 Document Revised: 01/28/2015 Document Reviewed: 09/01/2014 Elsevier Interactive Patient Education 2016 [employer]  

## 2016-03-11 NOTE — Therapy (Signed)
[facility] [address] [place], [place], [address] Phone: [phone]   Fax:  [phone]  Physical Therapy Treatment  Patient Details  Name: Nicole Herrera MRN: [id number] Date of Birth: [birth year] Referring Provider: Dr. Harlan Stains  Encounter Date: 03/11/2016      PT End of Session - 03/11/16 1928    Visit Number 2   Number of Visits 8   Date for PT Re-Evaluation 04/10/16   Authorization Type [employer]   Authorization Time Period 03-10-16 - 05-09-16   PT Start Time 0800   PT Stop Time 0845   PT Time Calculation (min) 45 min      No past medical history on file.  No past surgical history on file.  There were no vitals filed for this visit.      Subjective Assessment - 03/11/16 1918    Subjective Pt states she felt some vertigo this am - thinks she is a little better compared to yesterday; has been doing the neck stretches   Pertinent History neck surgery 1998 (disc herniations with fusion): h/o migraines   Patient Stated Goals resolve the vertigo   Currently in Pain? No/denies                          Vestibular Treatment/Exercise - 03/11/16 [id number]      Vestibular Treatment/Exercise   Vestibular Treatment Provided Canalith Repositioning   Canalith Repositioning Epley Manuever Left      EPLEY MANUEVER LEFT   Number of Reps  3   Overall Response  Improved Symptoms    RESPONSE DETAILS LEFT no nystagmus noted on 3rd rep of Epley's     NeuroRe-ed;  R Dix-Hallpike test (-) with no nystagmus and no c/o vertigo in test position:  L Dix-Hallpike test (+) with some mild Rotary upbeating nystagmus noted for approx. 5 sec duration in test position - indicative of L BPPV   Instructed in Brandt-Daroff exercises for L BPPV habituation if L BPPV not fully resolved  Also instructed pt in cervical retraction for posture retraining and cervical musc. strengthening   Self Care;  educated pt in etiology of BPPV - handout given; recommended drinking plenty of water for system hydration            PT Long Term Goals - 03/10/16 2053      PT LONG TERM GOAL #1   Title Pt will report at least 50% improvement in vertigo.  (04-10-16)   Time 4   Period Weeks   Status New     PT LONG TERM GOAL #2   Title Independent in HEP for vestibular and balance exercises.  (04-10-16)   Time 4   Period Weeks   Status New     PT LONG TERM GOAL #3   Title Perform SOT and establish goal as appropriate.  (04-10-16)   Time 4   Period Weeks   Status New               Plan - 03/11/16 1929    Clinical Impression Statement Pt had signs/symptoms consistent with L BPPV with some mild rotary upbeating nystgmus noted in L Dix-Hallpike test position; appeared to have resolved on 3rd rep of Epley's maneuver; pt continues to c/o tinnitus, which I have informed her that this is not related to the BPPV    Rehab Potential Good   PT Frequency 2x / week   PT Duration 4 weeks  PT Treatment/Interventions Vestibular;Therapeutic activities;Therapeutic exercise;Balance training;Neuromuscular re-education;Patient/family education;ADLs/Self Care Home Management;Canalith Repostioning   PT Next Visit Plan recheck L Dix-Hallpiket   PT Home Exercise Plan Brandt-Daroff exercises prn   Consulted and Agree with Plan of Care Patient      Patient will benefit from skilled therapeutic intervention in order to improve the following deficits and impairments:  Difficulty walking, Decreased balance, Dizziness  Visit Diagnosis: Dizziness and giddiness       G-Codes - 2016/04/07 09-17-57    Functional Assessment Tool Used c/o dizziness with L sidelying to sitting and with looking down   Functional Limitation Changing and maintaining body position   Changing and Maintaining Body Position Current Status [id number]) At least 60 percent but less than 80 percent impaired, limited or restricted   Changing and  Maintaining Body Position Goal Status [id number]) At least 20 percent but less than 40 percent impaired, limited or restricted      Problem List There are no active problems to display for this patient.   [id number], PT 03/11/2016, 7:33 PM  [facility] [address] [place], [place], [address] Phone: [phone]   Fax:  [phone]  Name: Nicole Herrera MRN: [id number] Date of Birth: 15-May-1946

## 2016-03-14 ENCOUNTER — Ambulatory Visit: Payer: Medicare PPO | Admitting: Physical Therapy

## 2016-03-14 VITALS — BP 143/87 | HR 60

## 2016-03-14 DIAGNOSIS — R42 Dizziness and giddiness: Secondary | ICD-10-CM | POA: Diagnosis not present

## 2016-03-14 DIAGNOSIS — R2689 Other abnormalities of gait and mobility: Secondary | ICD-10-CM | POA: Diagnosis not present

## 2016-03-15 ENCOUNTER — Ambulatory Visit: Payer: Medicare PPO | Admitting: Physical Therapy

## 2016-03-15 DIAGNOSIS — R42 Dizziness and giddiness: Secondary | ICD-10-CM | POA: Diagnosis not present

## 2016-03-15 DIAGNOSIS — R2689 Other abnormalities of gait and mobility: Secondary | ICD-10-CM | POA: Diagnosis not present

## 2016-03-15 NOTE — Therapy (Signed)
[facility] [address] [place], [place], [address] Phone: [phone]   Fax:  [phone]  Physical Therapy Treatment  Patient Details  Name: Nicole Herrera MRN: [id number] Date of Birth: 12/28/1945 Referring Provider: Dr. Harlan Stains  Encounter Date: 03/14/2016      PT End of Session - 03/15/16 1700    Visit Number 3   Number of Visits 8   Date for PT Re-Evaluation 04/10/16   Authorization Type [employer]   Authorization Time Period 03-10-16 - 05-09-16   PT Start Time 1402   PT Stop Time 1446   PT Time Calculation (min) 44 min      No past medical history on file.  No past surgical history on file.  Vitals:   03/14/16 1436 03/14/16 1445  BP: (!) 169/81 (!) 143/87  Pulse: 62 60        Subjective Assessment - 03/15/16 1657    Subjective Pt reports she was doing well after treatment on Friday until she went into [employer] and became dizzy with all the stimulation in the store   Pertinent History neck surgery 1998 (disc herniations with fusion): h/o migraines   Patient Stated Goals resolve the vertigo   Currently in Pain? No/denies      NeuroRe-ed:  L Dix-Hallpike test (-) for c/o room spinning vertigo in test position; pt did report vertigo with return to upright Position;  Performed Brandt-Daroff exercises 5 reps each side - pt reported some increase in symptoms on rep #4 from R side  BP recorded in seated and standing positions  L Epley maneuver performed 1 rep - for L BPPV - no nystagmus observed in any position, however, pt reported some dizziness In 1st position but did not describe as room spinning vertigo; pt did report dizziness with sidelying to sitting position in final transitional Movement of Epley maneuver  Recommend pt to perform Brandt-Daroff exercises for HEP and record BP at home with notes regarding symptoms and time of day  readings recorded                                PT Long Term Goals - 03/10/16 2053      PT LONG TERM GOAL #1   Title Pt will report at least 50% improvement in vertigo.  (04-10-16)   Time 4   Period Weeks   Status New     PT LONG TERM GOAL #2   Title Independent in HEP for vestibular and balance exercises.  (04-10-16)   Time 4   Period Weeks   Status New     PT LONG TERM GOAL #3   Title Perform SOT and establish goal as appropriate.  (04-10-16)   Time 4   Period Weeks   Status New               Plan - 03/15/16 1700    Clinical Impression Statement No nystagmus noted with Dix-Hallpike test; pt continues to c/o vertigo with return to upright position - symptoms did not fully resolve or significantly improve with habituation; symptoms do not appear to be consistent with BPPV at this time  Rehab Potential Good   PT Frequency 2x / week   PT Duration 4 weeks   PT Treatment/Interventions Vestibular;Therapeutic activities;Therapeutic exercise;Balance training;Neuromuscular re-education;Patient/family education;ADLs/Self Care Home Management;Canalith Repostioning   PT Next Visit Plan recheck L Dix-Hallpike: Do SOT   PT Home Exercise Plan Brandt-Daroff exercises prn   Consulted and Agree with Plan of Care Patient      Patient will benefit from skilled therapeutic intervention in order to improve the following deficits and impairments:  Difficulty walking, Decreased balance, Dizziness  Visit Diagnosis: Dizziness and giddiness     Problem List There are no active problems to display for this patient.   Alda Lea, PT 03/15/2016, 5:06 PM  [facility] [address] [place], [place], [address] Phone: [phone]   Fax:  [phone]  Name: Nicole Herrera MRN: [id number] Date of Birth: 29-Nov-1945

## 2016-03-16 NOTE — Therapy (Signed)
[facility] [address] [place], [place], [address] Phone: [phone]   Fax:  [phone]  Physical Therapy Treatment  Patient Details  Name: Nicole Herrera MRN: [id number] Date of Birth: 01-13-1946 Referring Provider: Dr. Harlan Stains  Encounter Date: 03/15/2016      PT End of Session - 03/16/16 1019    Visit Number 4   Number of Visits 8   Date for PT Re-Evaluation 04/10/16   Authorization Type [employer]   Authorization Time Period 03-10-16 - 05-09-16   PT Start Time 0932   PT Stop Time 1016   PT Time Calculation (min) 44 min      No past medical history on file.  No past surgical history on file.  There were no vitals filed for this visit.      Subjective Assessment - 03/16/16 1015    Subjective Pt reports she was very active yesterday evening with getting clothes and things ready for trip - did rather well with all the movements; talked to pharmacist who informed her that decongestant pills could raise BP   Pertinent History neck surgery 1998 (disc herniations with fusion): h/o migraines   Patient Stated Goals resolve the vertigo   Currently in Pain? No/denies                          Vestibular Treatment/Exercise - 03/16/16 0001      Vestibular Treatment/Exercise   Habituation Exercises Laruth Bouchard Daroff;Seated Horizontal Head Turns  5 reps each     Nestor Lewandowsky   Number of Reps  5   Symptom Description  pt c/o vertigo at 1/2 point of sidelying to sitting     Seated Horizontal Head Turns   Number of Reps  5   Symptom Description  no increase vertigo     BP recorded as follows;  Seated position  156/81 pulse 58                                         Standing 153/97    Pulse 62   Seated again at end of session:   149/95  L Dix-Hallpike test (-) for rotary nystagmus and no c/o vertigo in test position but pt reports dizziness with return to upright  Seated  position  Dynamic visual acuity test 1 line difference from static visual acuity -- line 10 static with line 9 dynamic (WNL's)        PT Education - 03/16/16 1019    Education provided Yes   Education Details continue to recommend [place] for habituation   Person(s) Educated Patient   Methods Explanation   Comprehension Verbalized understanding;Returned demonstration             PT Long Term Goals - 03/10/16 2053      PT LONG TERM GOAL #1   Title Pt will report at least 50% improvement in vertigo.  (04-10-16)   Time 4   Period Weeks   Status New     PT LONG TERM GOAL #2   Title Independent in HEP for vestibular and balance exercises.  (04-10-16)   Time 4   Period Weeks   Status New     PT LONG TERM GOAL #3   Title Perform SOT and establish goal as appropriate.  (04-10-16)   Time 4  Period Weeks   Status New               Plan - 03/16/16 1020    Clinical Impression Statement Pt does not have signs/symptoms consistent with BPPV at this time as no nystagmus noted in Dix-Hallpike test positions: symptoms appear to be due to possible sinus problems (pt is taking decongestant/ nasal spray) or possibly due to slightly high BP or stress/anxiety with upcoming trip    Rehab Potential Good   PT Frequency 2x / week   PT Duration 4 weeks   PT Treatment/Interventions Vestibular;Therapeutic activities;Therapeutic exercise;Balance training;Neuromuscular re-education;Patient/family education;ADLs/Self Care Home Management;Canalith Repostioning   PT Next Visit Plan recheck L Dix-Hallpike: Do SOT   PT Home Exercise Plan Brandt-Daroff exercises prn   Consulted and Agree with Plan of Care Patient      Patient will benefit from skilled therapeutic intervention in order to improve the following deficits and impairments:  Difficulty walking, Decreased balance, Dizziness  Visit Diagnosis: Dizziness and giddiness     Problem List There are no active problems  to display for this patient.   Alda Lea, PT 03/16/2016, 10:26 AM  [facility] [address] [place], [place], [address] Phone: [phone]   Fax:  [phone]  Name: JAMECIA ZAHN MRN: [id number] Date of Birth: 27-Apr-1946

## 2016-03-24 ENCOUNTER — Ambulatory Visit: Payer: Medicare PPO | Attending: Family Medicine | Admitting: Physical Therapy

## 2016-03-24 DIAGNOSIS — R2689 Other abnormalities of gait and mobility: Secondary | ICD-10-CM | POA: Insufficient documentation

## 2016-03-24 DIAGNOSIS — R42 Dizziness and giddiness: Secondary | ICD-10-CM | POA: Insufficient documentation

## 2016-03-25 NOTE — Therapy (Signed)
[facility] [address] [place], [place], [address] Phone: [phone]   Fax:  [phone]  Physical Therapy Treatment  Patient Details  Name: Nicole Herrera MRN: [id number] Date of Birth: [birth year] Referring Provider: Dr. Harlan Stains  Encounter Date: 03/24/2016      PT End of Session - 03/25/16 1656    Visit Number 5   Number of Visits 8   Date for PT Re-Evaluation 04/10/16   Authorization Type [employer]   Authorization Time Period 03-10-16 - 05-09-16   PT Start Time 0935   PT Stop Time 1018   PT Time Calculation (min) 43 min      No past medical history on file.  No past surgical history on file.  There were no vitals filed for this visit.      Subjective Assessment - 03/25/16 1652    Subjective Pt states she arrived back in town from [place] on Monday - thinks the flight home provoked vertigo; had increased pressure in ears after flight; took medication (Meclizine) which helped some; tinnitus is occurring - seems to get louder in the evenings                                                                                              Pertinent History neck surgery 1998 (disc herniations with fusion): h/o migraines   Patient Stated Goals resolve the vertigo   Currently in Pain? No/denies                          Vestibular Treatment/Exercise - 03/25/16 0001      Vestibular Treatment/Exercise   Habituation Exercises Brandt Daroff      EPLEY MANUEVER LEFT   Number of Reps  1   Overall Response  No change    RESPONSE DETAILS LEFT no nystagmus noted in initial position     [employer]   Number of Reps  3   Symptom Description  pt reported vertigo starting at 3/4 way up from R sidelying ; delayed onset with complete sitting upright from L sidelying; no change reported on 3rd rep     Seated Horizontal Head Turns   Number of Reps  3   Symptom Description  increased vertigo  reported with looking up and down      R Dix-Hallpike test (-) for nystagmus and c/o vertigo  L Dix-Hallpike test (+)  With minimal (3-4 beats of rotary nystagmus thought to have been observed) in test position, but none Noted in initial position of Epley maneuver for L BPPV  Self Care; discussed activities during trip/ airline flight thought to have provoked vertigo; discussed HEP of Brandt-Daroff       PT Education - 03/25/16 1656    Education provided Yes   Education Details recommend continuation of Brandt-Daroff exs   Person(s) Educated Patient   Methods Explanation   Comprehension Verbalized understanding;Returned demonstration             PT Long Term Goals - 03/10/16 2053      PT LONG TERM GOAL #1  Title Pt will report at least 50% improvement in vertigo.  (04-10-16)   Time 4   Period Weeks   Status New     PT LONG TERM GOAL #2   Title Independent in HEP for vestibular and balance exercises.  (04-10-16)   Time 4   Period Weeks   Status New     PT LONG TERM GOAL #3   Title Perform SOT and establish goal as appropriate.  (04-10-16)   Time 4   Period Weeks   Status New               Plan - 03/25/16 1657    Clinical Impression Statement Symptoms remain inconsistent for BPPV:  minimal beats of nystagmus noted in L Dix-Hallpike position (upbeating rotary) but none noted in initial position of L Epley maneuver;  head movements and return to sitting upright from sidelying positions do increase vertigo per pt report; increased visual stimulation appears to increase symptoms, however, pt testing normal for dynamic visual acuity test   Rehab Potential Good   PT Frequency 2x / week   PT Duration 4 weeks   PT Treatment/Interventions Vestibular;Therapeutic activities;Therapeutic exercise;Balance training;Neuromuscular re-education;Patient/family education;ADLs/Self Care Home Management;Canalith Repostioning   PT Next Visit Plan recheck L Dix-Hallpike: Do SOT    PT Home Exercise Plan Brandt-Daroff exercises prn   Consulted and Agree with Plan of Care Patient      Patient will benefit from skilled therapeutic intervention in order to improve the following deficits and impairments:  Difficulty walking, Decreased balance, Dizziness  Visit Diagnosis: Dizziness and giddiness     Problem List There are no active problems to display for this patient.   Alda Lea, PT 03/25/2016, 5:09 PM  [facility] [address] [place], [place], [address] Phone: [phone]   Fax:  [phone]  Name: Nicole Herrera MRN: [id number] Date of Birth: 1945/06/17

## 2016-03-28 ENCOUNTER — Ambulatory Visit: Payer: Medicare PPO | Admitting: Physical Therapy

## 2016-03-28 DIAGNOSIS — R42 Dizziness and giddiness: Secondary | ICD-10-CM | POA: Diagnosis not present

## 2016-03-28 DIAGNOSIS — R2689 Other abnormalities of gait and mobility: Secondary | ICD-10-CM | POA: Diagnosis not present

## 2016-03-29 NOTE — Therapy (Signed)
[facility] [address] [place], [place], [address] Phone: [phone]   Fax:  [phone]  Physical Therapy Treatment  Patient Details  Name: Nicole Herrera MRN: [id number] Date of Birth: 1945-09-20 Referring Provider: Dr. Harlan Stains  Encounter Date: 03/28/2016      PT End of Session - 03/29/16 1345    Visit Number 6   Number of Visits 8   Date for PT Re-Evaluation 04/10/16   Authorization Type [employer]   Authorization Time Period 03-10-16 - 05-09-16   PT Start Time 1146   PT Stop Time 1247   PT Time Calculation (min) 61 min      No past medical history on file.  No past surgical history on file.  There were no vitals filed for this visit.      Subjective Assessment - 03/29/16 1340    Subjective Pt states dizziness is not much better; continues to have alot of tinnitus   Pertinent History neck surgery 1998 (disc herniations with fusion): h/o migraines   Patient Stated Goals resolve the vertigo   Currently in Pain? No/denies          Sensory Organization Test:  Composite score 70/100 (WNL's as N= 68/100)  Somatosensory WNL's Visual slightly decreased at 79/100 with N= 84/100 Vestibular decreased at 53/100 with N=55/100  Condition 1 = all 3 trials normal Condition 2 = all 3 trials normal Condition 3 = all 3 trials normal Condition 4 = Trial 1 below normal; trials 2 & 3 WNL's Condition 5 = trial 1 below N slightly: #2 WNL's: #3 below normal Condition 6 = trials 1 and 2 below N with #3 WNL's   Pt performed x1 viewing exercise in standing  -- 30 secs horizontal and vertical - plain background  Standing on foam - with EO and EC with feet apart - with head turns with targets 5 reps each;  With EC 5 reps each With head turns with CGA  Pt was given these exercises for HEP - verbalized and demonstrated understanding of these exs.               Balance Exercises - 03/29/16 1341       Balance Exercises: Standing   Standing Eyes Opened Narrow base of support (BOS);Wide (BOA);Head turns;Foam/compliant surface   Standing Eyes Closed Narrow base of support (BOS);Wide (BOA);Head turns;Foam/compliant surface;5 reps           PT Education - 03/29/16 1344    Education provided Yes   Education Details instructed in x1 viewing exercise and in balance on foam - with EO and EC with head turns   Person(s) Educated Patient   Methods Explanation;Handout   Comprehension Verbalized understanding;Returned demonstration             PT Long Term Goals - 03/10/16 2053      PT LONG TERM GOAL #1   Title Pt will report at least 50% improvement in vertigo.  (04-10-16)   Time 4   Period Weeks   Status New     PT LONG TERM GOAL #2   Title Independent in HEP for vestibular and balance exercises.  (04-10-16)   Time 4   Period Weeks   Status New     PT LONG TERM GOAL #3   Title Perform SOT and establish goal as appropriate.  (04-10-16)   Time 4   Period Weeks   Status New  Plan - 03/29/16 1345    Clinical Impression Statement Pt has minimally decreased visual and vestibular input per SOT; pt continues to c/o tinnitus with dizziness; symptoms continue to appear to be of mixed etiology including positional, visual and cervical in nature                                                                                                                                       Rehab Potential Good   PT Frequency 2x / week   PT Duration 4 weeks   PT Treatment/Interventions Vestibular;Therapeutic activities;Therapeutic exercise;Balance training;Neuromuscular re-education;Patient/family education;ADLs/Self Care Home Management;Canalith Repostioning   PT Next Visit Plan check HEP; reassess vertigo prn   PT Home Exercise Plan Brandt-Daroff exercises prn   Consulted and Agree with Plan of Care Patient      Patient will benefit from skilled therapeutic  intervention in order to improve the following deficits and impairments:  Difficulty walking, Decreased balance, Dizziness  Visit Diagnosis: Dizziness and giddiness  Other abnormalities of gait and mobility     Problem List There are no active problems to display for this patient.   Alda Lea, PT 03/29/2016, 1:55 PM  [facility] [address] [place], [place], [address] Phone: [phone]   Fax:  [phone]  Name: Nicole Herrera MRN: [id number] Date of Birth: [birth year]

## 2016-03-29 NOTE — Patient Instructions (Signed)
Standing on foam - in corner - EO and EC with head turns  x1 viewing exercise - gaze stabilization in standing

## 2016-04-05 ENCOUNTER — Ambulatory Visit: Payer: Medicare PPO | Admitting: Physical Therapy

## 2016-04-05 VITALS — BP 171/84

## 2016-04-05 DIAGNOSIS — R2689 Other abnormalities of gait and mobility: Secondary | ICD-10-CM | POA: Diagnosis not present

## 2016-04-05 DIAGNOSIS — R42 Dizziness and giddiness: Secondary | ICD-10-CM

## 2016-04-06 NOTE — Therapy (Signed)
[facility] [address] [place], [place], [address] Phone: [phone]   Fax:  [phone]  Physical Therapy Treatment  Patient Details  Name: Nicole Herrera MRN: [id number] Date of Birth: Mar 05, 1946 Referring Provider: Dr. Harlan Stains  Encounter Date: 04/05/2016      PT End of Session - 04/06/16 2302    Visit Number 7   Number of Visits 8   Date for PT Re-Evaluation 04/10/16   Authorization Type [employer]   Authorization Time Period 03-10-16 - 05-09-16   PT Start Time 1534   PT Stop Time 1631   PT Time Calculation (min) 57 min      No past medical history on file.  No past surgical history on file.  Vitals:   04/05/16 1646  BP: (!) 171/84        Subjective Assessment - 04/06/16 2300    Subjective Had a severe headache feeling alot of pressure on top of head on Saturday - took Aleve and it improved; pt reports she continues to have the ringing her ears   Pertinent History neck surgery 1998 (disc herniations with fusion): h/o migraines   Patient Stated Goals resolve the vertigo   Currently in Pain? No/denies      NeuroRe-ed:  Reviewed HEP of balance on foam with head turns, EO and EC:  Reviewed gaze stabilization exercise - horizontal  and vertical head turns - plain background  Self Care:  Discussed persistent symptoms of vertigo - initially consistent with BPPV but no nystagmus observed with any positional testing  at present time; pt reports moderate to severe tinnitus, feelings of "not clear" with elevated BP noted during today's session and in previous  PT session; also question possibility of cervicogenic vertigo  Dynamic visual acuity test 2 line difference (WNL's)  SOT scores not significantly decreased indicating no severe vestibular deficits noted in maintaining balance  Pt reports she is making appt to see ENT, Dr. Thornell Mule (no availability until April 2018)  Pt questions need for  neurology consult as well to rule out central etiology of vertigo                              PT Long Term Goals - 04/06/16 2303      PT LONG TERM GOAL #1   Title Pt will report at least 50% improvement in vertigo.  (04-10-16)   Baseline pt reports improvement but status fluctuates - 04-05-16   Status Partially Met     PT LONG TERM GOAL #2   Title Independent in HEP for vestibular and balance exercises.  (04-10-16)   Status Achieved     PT LONG TERM GOAL #3   Title Perform SOT and establish goal as appropriate.  (04-10-16)   Baseline SOT WNL's overall - no significant deficits noted per test   Status Achieved               Plan - 04/06/16 2304    Clinical Impression Statement LTG's have been partially met with pt reporting some improvement in vertigo since initial eval but not 50% improved per stated LTG #1.  Etiology of vertigo remains unclear with pt reporting moderate tinnitus; pt had elevated BP on 04-05-16 visit which possibly could account for feeling of "not clear feeling":  also question of cervicogenic vertigo; symptoms not completely consistent with BPPV although head movements provoke vertigo per pt report.  Rehab Potential Good   PT Treatment/Interventions Vestibular;Therapeutic activities;Therapeutic exercise;Balance training;Neuromuscular re-education;Patient/family education;ADLs/Self Care Home Management;Canalith Repostioning   PT Next Visit Plan N/A - D/C   PT Home Exercise Plan Brandt-Daroff exercises prn; gaze stabilization and balance on foam   Consulted and Agree with Plan of Care Patient      Patient will benefit from skilled therapeutic intervention in order to improve the following deficits and impairments:  Difficulty walking, Decreased balance, Dizziness  Visit  Diagnosis: Dizziness and giddiness       G-Codes - 2016-04-18 2309/07/31    Functional Assessment Tool Used c/o dizziness with head movements and c/o tinnitus   Functional Limitation Changing and maintaining body position   Changing and Maintaining Body Position Goal Status (U0156) At least 20 percent but less than 40 percent impaired, limited or restricted   Changing and Maintaining Body Position Discharge Status (F5379) At least 40 percent but less than 60 percent impaired, limited or restricted      PHYSICAL THERAPY DISCHARGE SUMMARY  Visits from Start of Care: 7  Current functional level related to goals / functional outcomes: See above for progress towards goals   Remaining deficits: Cont. C/o dizziness with tinnitus   Education / Equipment: Pt has been instructed in HEP for habituation and vestibular exercises Plan: Patient agrees to discharge.  Patient goals were partially met. Patient is being discharged due to lack of progress.  ?????       Etiology of vertigo is unknown - PT at this time is not helping to reduce vertigo. Pt would benefit from further diagnostic work up To determine etiology of dizziness/tinnitus.    Problem List There are no active problems to display for this patient.   Alda Lea, PT 04/06/2016, 11:13 PM  [facility] [address] [place], [place], [address] Phone: [phone]   Fax:  [phone]  Name: Nicole Herrera MRN: [id number] Date of Birth: 1945/07/21

## 2016-04-08 DIAGNOSIS — I10 Essential (primary) hypertension: Secondary | ICD-10-CM | POA: Diagnosis not present

## 2016-04-08 DIAGNOSIS — R42 Dizziness and giddiness: Secondary | ICD-10-CM | POA: Diagnosis not present

## 2016-04-08 DIAGNOSIS — R51 Headache: Secondary | ICD-10-CM | POA: Diagnosis not present

## 2016-04-11 ENCOUNTER — Other Ambulatory Visit: Payer: Self-pay | Admitting: Family Medicine

## 2016-04-11 DIAGNOSIS — R42 Dizziness and giddiness: Secondary | ICD-10-CM

## 2016-04-11 DIAGNOSIS — M65311 Trigger thumb, right thumb: Secondary | ICD-10-CM | POA: Diagnosis not present

## 2016-04-20 ENCOUNTER — Ambulatory Visit
Admission: RE | Admit: 2016-04-20 | Discharge: 2016-04-20 | Disposition: A | Discharge status: Home / Self Care | Payer: Medicare PPO | Source: Ambulatory Visit | Attending: Family Medicine | Admitting: Family Medicine

## 2016-04-20 DIAGNOSIS — R42 Dizziness and giddiness: Secondary | ICD-10-CM

## 2016-04-22 DIAGNOSIS — M65311 Trigger thumb, right thumb: Secondary | ICD-10-CM | POA: Diagnosis not present

## 2016-05-04 ENCOUNTER — Other Ambulatory Visit: Payer: Self-pay | Admitting: Family Medicine

## 2016-05-04 DIAGNOSIS — Z1231 Encounter for screening mammogram for malignant neoplasm of breast: Secondary | ICD-10-CM

## 2016-05-10 ENCOUNTER — Ambulatory Visit (INDEPENDENT_AMBULATORY_CARE_PROVIDER_SITE_OTHER): Payer: Medicare PPO | Admitting: Neurology

## 2016-05-10 ENCOUNTER — Encounter: Payer: Self-pay | Admitting: Neurology

## 2016-05-10 ENCOUNTER — Telehealth: Payer: Self-pay | Admitting: Neurology

## 2016-05-10 DIAGNOSIS — R42 Dizziness and giddiness: Secondary | ICD-10-CM

## 2016-05-10 NOTE — Telephone Encounter (Signed)
Called and left patient a message about getting her Doppler scheduled. No Pa needed.

## 2016-05-10 NOTE — Telephone Encounter (Signed)
Returned call to patient - explained that the ordered tests provide very different information that Dr. Krista Blue will use to make a diagnosis.  She will return the call to cardiology and speak w/ them about the event monitor.

## 2016-05-10 NOTE — Progress Notes (Signed)
PATIENT: Nicole Herrera DOB: Jun 12, 1945  Chief Complaint  Patient presents with  . Dizziness    Orhtostatic Vitals: lying: 148/80, 62, sitting: 158 81, 60, standing: 173/89, 60. Reports vertigo and tinnitus since July 2017.  She had temporary relief after vestibular rehab. She had a recent MRI brain.  She has been evaluated by ENT.  Marland Kitchen PCP    Harlan Stains, MD     HISTORICAL  Nicole Herrera is 70 years old left-handed female, seen in refer by her primary care Dr. Harlan Stains for evaluation of dizziness, initial evaluation was on May 10 2016.  She had past medical history of hypertension, hyperlipidemia, allergy, acid reflux, barrette's esophagus,  Since June 2017, she noticed intermittent ringing or ear, pressure sensation behind her eyes, most severe during the morning time, also began to notice intermittent lightheadedness, there was no vertigo, no hearing loss, she was evaluated by vestibular rehabilitation, after few section of treatment, she had mild improvement, but continue have intermittent episode, she described it as washing off sensation,fogginess covering her whole brain lasted about 30 minutes, with no loss of consciousness, it can happen in standing, lying down, or sitting position, but more frequent when she moves about, such as turning her head quickly, sometimes associated with mild cardiac palpitation, she denied chest pain, each episode last about 30 minutes, she performed to lying down to wait for it to pass, sometimes she has mild confusion with it.  She denies depression anxiety  I reviewed laboratory evaluations in 2017, normal CMP, with creatinine of 0.6, LDL was 134, cholesterol was 204, normal CBC, with hemoglobin of 12.6, normal TSH 1.78  I personally reviewed MRI brain wo contrast on Apr 20 2016, that was normal She was also evaluated by ophthalmologist, that was normal  REVIEW OF SYSTEMS: Full 14 system review of systems performed and notable only for  fatigue, ringing ears, spinning sensation, blurred vision, eye pain, feeling hot, feeling cold, flushing, allergy, skin sensitivity, headaches, weakness, dizziness, decreased energy  ALLERGIES: Allergies  Allergen Reactions  . Diphenhydramine Itching  . Morphine Itching  . Amlodipine Besylate Other (See Comments)  . Valsartan Other (See Comments)    HOME MEDICATIONS: Current Outpatient Prescriptions  Medication Sig Dispense Refill  . atenolol (TENORMIN) 25 MG tablet Taking 3 tablets in the morning and 1 tablet in the afternoon.    . fluticasone (FLONASE) 50 MCG/ACT nasal spray as needed.    Marland Kitchen ibuprofen (ADVIL,MOTRIN) 200 MG tablet Take 200 mg by mouth as needed.    . loratadine (CLARITIN) 10 MG tablet Take by mouth as needed.    . pantoprazole (PROTONIX) 40 MG tablet as needed.    Marland Kitchen PREMARIN 0.3 MG tablet as needed.     . traMADol (ULTRAM) 50 MG tablet as needed.     No current facility-administered medications for this visit.     PAST MEDICAL HISTORY: Past Medical History:  Diagnosis Date  . Acid reflux   . Barrett's esophagus   . Hyperlipemia   . Hypertension   . Migraine   . Neck pain   . Sinusitis   . Tinnitus   . Trigger finger   . Vertigo     PAST SURGICAL HISTORY: Past Surgical History:  Procedure Laterality Date  . ABDOMINAL HYSTERECTOMY    . NASAL SINUS SURGERY    . NECK SURGERY    . TRIGGER FINGER RELEASE      FAMILY HISTORY: Family History  Problem Relation Age of Onset  .  Alzheimer's disease Mother   . Alcoholism Father   . Emphysema Father   . Heart disease Father   . Diabetes Brother   . Diabetes Maternal Grandmother     SOCIAL HISTORY:  Social History   Social History  . Marital status: Single    Spouse name: N/A  . Number of children: 0  . Years of education: Masters   Occupational History  . Retired Pharmacist, hospital    Social History Main Topics  . Smoking status: Former Research scientist (life sciences)  . Smokeless tobacco: Never Used  . Alcohol use Yes      Comment: "very little"   . Drug use: No  . Sexual activity: Not on file   Other Topics Concern  . Not on file   Social History Narrative   Lives at home alone.   Left-handed.   2 cups caffeine per day.     PHYSICAL EXAM   Vitals:   05/10/16 0846  BP: (!) 148/80  Pulse: 62  Weight: 211 lb (95.7 kg)  Height: 5\' 6"  (1.676 m)    Not recorded      Body mass index is 34.06 kg/m.  PHYSICAL EXAMNIATION:  Gen: NAD, conversant, well nourised, obese, well groomed                     Cardiovascular: Regular rate rhythm, no peripheral edema, warm, nontender. Eyes: Conjunctivae clear without exudates or hemorrhage Neck: Supple, no carotid bruits. Pulmonary: Clear to auscultation bilaterally   NEUROLOGICAL EXAM:  MENTAL STATUS: Speech:    Speech is normal; fluent and spontaneous with normal comprehension.  Cognition:     Orientation to time, place and person     Normal recent and remote memory     Normal Attention span and concentration     Normal Language, naming, repeating,spontaneous speech     Fund of knowledge   CRANIAL NERVES: CN II: Visual fields are full to confrontation. Fundoscopic exam is normal with sharp discs and no vascular changes. Pupils are round equal and briskly reactive to light. CN III, IV, VI: extraocular movement are normal. No ptosis. CN V: Facial sensation is intact to pinprick in all 3 divisions bilaterally. Corneal responses are intact.  CN VII: Face is symmetric with normal eye closure and smile. CN VIII: Hearing is normal to rubbing fingers CN IX, X: Palate elevates symmetrically. Phonation is normal. CN XI: Head turning and shoulder shrug are intact CN XII: Tongue is midline with normal movements and no atrophy.  MOTOR: There is no pronator drift of out-stretched arms. Muscle bulk and tone are normal. Muscle strength is normal.  REFLEXES: Reflexes are 2+ and symmetric at the biceps, triceps, knees, and ankles. Plantar responses are  flexor.  SENSORY: Intact to light touch, pinprick, positional sensation and vibratory sensation are intact in fingers and toes.  COORDINATION: Rapid alternating movements and fine finger movements are intact. There is no dysmetria on finger-to-nose and heel-knee-shin.    GAIT/STANCE: Posture is normal. Gait is steady with normal steps, base, arm swing, and turning. Heel and toe walking are normal. Tandem gait is normal.  Romberg is absent.   DIAGNOSTIC DATA (LABS, IMAGING, TESTING) - I reviewed patient records, labs, notes, testing and imaging myself where available.   ASSESSMENT AND PLAN  JALEIA BERENGUER is a 70 y.o. female   Intermittent dizziness episode  Differentiation diagnosis include hypoperfusion, remote possibility of seizure  Complete evaluation with EEG  Cardiac monitoring,  Ultrasound of carotid artery  Marcial Pacas, M.D. Ph.D.  [facility] [address], [place], [place] [address] Ph: [phone] Fax: [phone]  CC: Harlan Stains, MD

## 2016-05-10 NOTE — Telephone Encounter (Signed)
Called and spoke to patient scheduled her Carotid Doppler here at [facility] for  05/11/2016. Patient is having her EEG 06/17/2015 at [facility].  Velora Heckler has called patient to scheduled her Cardiac Event Monitor . Patient want's to no if she can hold off on Cardiac Event monitor for Now until Dr. Rhea Belton get's Doppler results and EEG results ?

## 2016-05-12 ENCOUNTER — Other Ambulatory Visit: Payer: Medicare PPO

## 2016-05-12 ENCOUNTER — Telehealth: Payer: Self-pay | Admitting: Neurology

## 2016-05-12 NOTE — Telephone Encounter (Signed)
Called patient to reschedule her apt. She did not answer so I left a VM to let her know it was canceled and she needed to call us back.

## 2016-05-19 ENCOUNTER — Other Ambulatory Visit: Payer: Medicare PPO

## 2016-05-25 ENCOUNTER — Ambulatory Visit (INDEPENDENT_AMBULATORY_CARE_PROVIDER_SITE_OTHER): Payer: Medicare PPO

## 2016-05-25 DIAGNOSIS — R42 Dizziness and giddiness: Secondary | ICD-10-CM | POA: Diagnosis not present

## 2016-06-03 DIAGNOSIS — E871 Hypo-osmolality and hyponatremia: Secondary | ICD-10-CM | POA: Diagnosis not present

## 2016-06-03 DIAGNOSIS — R42 Dizziness and giddiness: Secondary | ICD-10-CM | POA: Diagnosis not present

## 2016-06-03 DIAGNOSIS — I10 Essential (primary) hypertension: Secondary | ICD-10-CM | POA: Diagnosis not present

## 2016-06-09 ENCOUNTER — Encounter: Payer: Self-pay | Admitting: Neurology

## 2016-06-10 ENCOUNTER — Telehealth: Payer: Self-pay | Admitting: Neurology

## 2016-06-10 DIAGNOSIS — E871 Hypo-osmolality and hyponatremia: Secondary | ICD-10-CM | POA: Diagnosis not present

## 2016-06-10 NOTE — Telephone Encounter (Signed)
I reviewed ultrasound of carotid artery dated May 25 2016, there was no hemodynamic significant stenosis,

## 2016-06-10 NOTE — Telephone Encounter (Signed)
Dr. Krista Blue has emailed the patient these results.

## 2016-06-16 ENCOUNTER — Ambulatory Visit (INDEPENDENT_AMBULATORY_CARE_PROVIDER_SITE_OTHER): Payer: Medicare PPO | Admitting: Neurology

## 2016-06-16 DIAGNOSIS — R42 Dizziness and giddiness: Secondary | ICD-10-CM

## 2016-06-16 DIAGNOSIS — R55 Syncope and collapse: Secondary | ICD-10-CM | POA: Diagnosis not present

## 2016-06-16 NOTE — Procedures (Signed)
    History:  Nicole Herrera is a 71 year old patient with a history of dizziness associated with a foggy-headed sensation lasting about 30 minutes without associated loss of consciousness. The patient is being evaluated for these events.  This is a routine EEG. No skull defects are noted. Medications include atenolol, Flonase, Claritin, Protonix, Premarin, and Ultram.   EEG classification: Normal awake  Description of the recording: The background rhythms of this recording consists of a fairly well modulated medium amplitude alpha rhythm of 8 Hz that is reactive to eye opening and closure. As the record progresses, the patient appears to remain in the waking state throughout the recording. Photic stimulation was performed, resulting in a bilateral and symmetric photic driving response. Hyperventilation was also performed, resulting in a minimal buildup of the background rhythm activities without significant slowing seen. At no time during the recording does there appear to be evidence of spike or spike wave discharges or evidence of focal slowing. EKG monitor shows no evidence of cardiac rhythm abnormalities with a heart rate of 66.  Impression: This is a normal EEG recording in the waking state. No evidence of ictal or interictal discharges are seen.

## 2016-06-20 ENCOUNTER — Telehealth: Payer: Self-pay | Admitting: *Deleted

## 2016-06-20 NOTE — Telephone Encounter (Signed)
-----   Message from Kathrynn Ducking, MD sent at 06/16/2016  5:53 PM EST ----- Please call the patient and let her know that the EEG study was normal. Thank you.

## 2016-06-20 NOTE — Telephone Encounter (Signed)
Left message, with normal results, on her voicemail (ok per DPR).  Provided our number to call with any questions.

## 2016-06-28 ENCOUNTER — Ambulatory Visit (INDEPENDENT_AMBULATORY_CARE_PROVIDER_SITE_OTHER): Payer: Medicare PPO

## 2016-06-28 DIAGNOSIS — Z1231 Encounter for screening mammogram for malignant neoplasm of breast: Secondary | ICD-10-CM

## 2016-07-26 ENCOUNTER — Ambulatory Visit: Payer: Medicare PPO | Admitting: Neurology

## 2016-08-02 ENCOUNTER — Other Ambulatory Visit (HOSPITAL_COMMUNITY): Payer: Medicare PPO

## 2016-08-11 DIAGNOSIS — R42 Dizziness and giddiness: Secondary | ICD-10-CM | POA: Diagnosis not present

## 2016-08-11 DIAGNOSIS — M542 Cervicalgia: Secondary | ICD-10-CM | POA: Diagnosis not present

## 2016-08-18 DIAGNOSIS — M65312 Trigger thumb, left thumb: Secondary | ICD-10-CM | POA: Diagnosis not present

## 2016-08-29 ENCOUNTER — Ambulatory Visit: Payer: Medicare PPO | Attending: Family Medicine | Admitting: Rehabilitative and Restorative Service Providers"

## 2016-08-29 DIAGNOSIS — R293 Abnormal posture: Secondary | ICD-10-CM | POA: Insufficient documentation

## 2016-08-29 DIAGNOSIS — R2689 Other abnormalities of gait and mobility: Secondary | ICD-10-CM | POA: Diagnosis not present

## 2016-08-29 DIAGNOSIS — R42 Dizziness and giddiness: Secondary | ICD-10-CM | POA: Diagnosis not present

## 2016-08-29 DIAGNOSIS — M542 Cervicalgia: Secondary | ICD-10-CM | POA: Diagnosis not present

## 2016-08-29 NOTE — Therapy (Signed)
[facility] [address] [place], [place], [address] Phone: [phone]   Fax:  [phone]  Physical Therapy Evaluation  Patient Details  Name: Nicole Herrera MRN: [id number] Date of Birth: [birth year] Referring Provider: Silvestre Moment. MD  Encounter Date: 08/29/2016      PT End of Session - 08/29/16 1203    Visit Number 1   Number of Visits 8   Date for PT Re-Evaluation 10/13/16  plan for 2x/week for 4 weeks, set 45 day cert due to wait list for schedule   Authorization Type [employer] medicare   PT Start Time 0930   PT Stop Time 1025   PT Time Calculation (min) 55 min   Activity Tolerance Patient tolerated treatment well   Behavior During Therapy [facility] for tasks assessed/performed      Past Medical History:  Diagnosis Date  . Acid reflux   . Barrett's esophagus   . Hyperlipemia   . Hypertension   . Migraine   . Neck pain   . Sinusitis   . Tinnitus   . Trigger finger   . Vertigo     Past Surgical History:  Procedure Laterality Date  . ABDOMINAL HYSTERECTOMY    . NASAL SINUS SURGERY    . NECK SURGERY    . TRIGGER FINGER RELEASE      There were no vitals filed for this visit.       Subjective Assessment - 08/29/16 0932    Subjective The patient returns to PT with diagnosis of cervicalgia.  She was seen last year at our clinic for dizziness.  She noted past h/o "fogginess".  She was worked up by Dr. Claiborne Rigg (ENT) and told nothing more could be done.  Saw Dr. Laurance Flatten ENT and recently referred for cervicalgia (also in his notes has possible pathologies of eustachian tube dysfunction, and possible migraines).  Patient reports she has had MRI, EEG, carotid artery studies, and seen neurologist (all WNLs).   Patient describes intermittent spells of sudden onset of sensation of "wave moving across" with lightheadedness, fogginess, feels in back of eye socket, weird sensation in frontal region.  She  sometimes notes visual aura where she senses that right side of visual field is moving like a ceiling fan moving.  Ironing and looking down appears to aggravate symptoms.   These spells vary in length lasting minutes to 1/2 hour.  These spells occur intermittently (went away for a month and came back in March).  Also notes head pressure and left ear pain and tinnitus bilaterally.     Pertinent History Neck fusion in late 1990s (Dr. Trenton Gammon) -- had bulging discs.  H/o migraines that cleared with neck surgery (had for 3 days and then hangover x 2 days).     Patient Stated Goals "I'm looking for answers."   Currently in Pain? Yes   Pain Score 3   "I've learned to live with pain in neck"  Goes up to a 9/10.   Pain Location Neck   Pain Orientation Lower;Posterior   Pain Descriptors / Indicators Aching;Tightness   Pain Type Chronic pain   Pain Radiating Towards L arm (also has L trigger finger).    Pain Onset More than a month ago   Pain Frequency Intermittent   Aggravating Factors  computer work, other household tasks   Pain Relieving Factors exercises help, sleeping            [facility] PT Assessment - 08/29/16 6962  Assessment   Medical Diagnosis cervicalgia   Referring Provider Silvestre Moment. MD   Onset Date/Surgical Date --  10/2015   Prior Therapy known to our clinic from prior therapy     Precautions   Precautions None     Restrictions   Weight Bearing Restrictions No     Balance Screen   Has the patient fallen in the past 6 months No   Has the patient had a decrease in activity level because of a fear of falling?  No   Is the patient reluctant to leave their home because of a fear of falling?  No     Home Ecologist residence     Prior Function   Level of Independence Independent     Cognition   Overall Cognitive Status Within Functional Limits for tasks assessed   Memory --  notes mild memory changes more consistently      Observation/Other Assessments   Focus on Therapeutic Outcomes (FOTO)  not captured     ROM / Strength   AROM / PROM / Strength AROM     AROM   AROM Assessment Site Cervical   Cervical Flexion 28   Cervical Extension 26  tightness noted   Cervical - Right Side Bend 25   Cervical - Left Side Bend 20   Cervical - Right Rotation 50   Cervical - Left Rotation 45     Palpation   Palpation comment Patient has trigger points in scalenes, suboccipitals, upper trapezius.  Pressure on these points lead to a "wave" of dizziness (described as visual changes of floaters) provoked with pressure points.  ?  Could trigger points be leading to visual aura changes (migraine/tension headache related vertigo)?      Special Tests    Special Tests Cervical   Cervical Tests Dictraction     Distraction Test   Findngs Positive   Comment Cervical distraction in a supine position relieves general tightness/stiffness in neck.  Trigger point activation in cervical musculature aggravates symptoms.            Vestibular Assessment - 08/29/16 0951      Vestibular Assessment   General Observation The patient reports intermittent imbalance/ mostly during a foggy sensation or wave of lightheadedness.        Symptom Behavior   Type of Dizziness Lightheadedness  foggy sensation   Frequency of Dizziness intermittently   Duration of Dizziness minutes up to 1/2 hour   Aggravating Factors Mornings;Turning head quickly  looking down   Relieving Factors Head stationary  denies light or noise sensitivity     Occulomotor Exam   Occulomotor Alignment Normal   Spontaneous Absent   Gaze-induced Absent   Head shaking Horizontal Absent  with frenzel lenses donned   Smooth Pursuits Saccades  mild saccades and pressure when looking to left   Saccades Intact   Comment with frenzel lenses donned, no nystagmus noted     Vestibulo-Occular Reflex   VOR 1 Head Only (x 1 viewing) slow pace, able to do without  correction to target- WNLs.   Comment Head impulse test=positive bilaterally for refixation saccade.   Patient experiencing a "wave" of dizziness that mimics her complaints day to day.      Positional Testing   Dix-Hallpike --  Denies all spinning with getting into/out of bed.     Positional Sensitivities   Head Turning x 5 Mild dizziness   Head Nodding x 5 No dizziness  Positional Sensitivities Comments Performed seated head/body movement with 3 different scenarios to assess head versus neck movement and provocation of symptoms:  1) Moved head/body en block R<>L while patient sat on swivel chair with 0/10 symptoms (inner ear stimulated/ neck neutral), 2) Moved body by walking feet R and L while head held at neutral (neck movement but no head movement) 0/10 symptoms.  3) Keep body still and move head right and left (stimulates inner ear with head motion and have neck movement) provoked mild symptoms.  *Test did not differentiate further head motion versus neck range at this time*.                       PT Education - 08/29/16 1202    Education provided Yes   Education Details nature of PT focus and goals   Person(s) Educated Patient   Methods Explanation   Comprehension Verbalized understanding             PT Long Term Goals - 08/29/16 1204      PT LONG TERM GOAL #1   Title The patient will return demonstrate gaze x 1 adaptation x 30 seconds nonstop without sensation of dizziness ("waviness"). Target date for all LTGs is 10/13/16 (see note in End of Session)   Time 4   Period Weeks     PT LONG TERM GOAL #2   Title The patient will return demonstrate cervical stretching/flexibility exercises for HEP for muscle length.   Time 4   Period Weeks     PT LONG TERM GOAL #3   Title The patient will tolerate seated head motion x 5 reps without reports of dizziness or 'waviness'.   Time 4   Period Weeks     PT LONG TERM GOAL #4   Title The patient will be further  assessed for kinesthetic cervical sense.   Time 4   Period Weeks     PT LONG TERM GOAL #5   Title The patient will report neck pain at worst is < or equal to 6/10 (currently 9/10 after typing).   Time 4   Period Weeks     Additional Long Term Goals   Additional Long Term Goals Yes     PT LONG TERM GOAL #6   Title The patient will return demo proper positioning at computer to reduce postural strain/pain.   Time 4   Period Weeks               Plan - 08/29/16 1023    Clinical Impression Statement The patient is a 71 year old female with 10 month history of lightheadedness, "waviness", worsening neck pain returning to our clinic today for referral for cervicalgia.  The patient's clinical examination reveals mildly abnormal smooth pursuits with occasional catch-up saccades noted (has seen neurologist, does have h/o migraines).  VOR testing reveals bilateral refixation saccade to head impulse test indicating diminished VOR bilaterally.  Patient has motion sensitivity noted with seated horizontal head motion, however we could not determine neck versus inner ear using Head-Neck Differentiation Test today.  The patient has improved symptoms with cervical distraction and provocation of visual aura type changes (floaters with visual movement) noted after trigger point palpation.  Patient's clinical exam may indicate involvement of both cervicogenic symptoms, as well as hypofunction in the vestibular system.  Balance/gait testing not performed today and positional testing not performed today.  At end of session, patient experienced high pitched tinnitus and "waves" of dizziness (this  was after neck palpation) that improved with rest x 10 minutes before leaving.   Rehab Potential Good   Clinical Impairments Affecting Rehab Potential multi-factorial symptoms (neck with possible migranous component? due to visual aura changes and decreased VOR)   PT Frequency 2x / week   PT Duration 4 weeks   PT  Treatment/Interventions ADLs/Self Care Home Management;Therapeutic exercise;Therapeutic activities;Neuromuscular re-education;Balance training;Patient/family education;Gait training;Dry needling;Manual techniques   PT Next Visit Plan VOR x 1, KINESTHETIC NECK TESTING, kinesthetic neck training, dry needling, manual therapy/ sub occipital release and trigger point release, home stretching   PT Home Exercise Plan ENT provided [email or web address]  *will watch videos to ensure patient performing correctly for trigger point release   Consulted and Agree with Plan of Care Patient      Patient will benefit from skilled therapeutic intervention in order to improve the following deficits and impairments:  Decreased range of motion, Abnormal gait, Dizziness, Impaired vision/preception, Decreased balance, Decreased activity tolerance  Visit Diagnosis: Cervicalgia  Abnormal posture  Dizziness and giddiness  Other abnormalities of gait and mobility      G-Codes - 2016-09-06 1228    Functional Assessment Tool Used (Outpatient Only) Clinical judgment; patient in 3/10-9/10 neck pain.    Functional Limitation Mobility: Walking and moving around   Mobility: Walking and Moving Around Current Status [phone]) At least 40 percent but less than 60 percent impaired, limited or restricted   Mobility: Walking and Moving Around Goal Status [phone]) At least 20 percent but less than 40 percent impaired, limited or restricted       Problem List Patient Active Problem List   Diagnosis Date Noted  . Dizziness 05/10/2016    WEAVER,CHRISTINA, PT Sep 06, 2016, 2:40 PM  [facility] [address] [place], [place], [address] Phone: [phone]   Fax:  [phone]  Name: ALIYHA FORNES MRN: [id number] Date of Birth: 1945-11-18

## 2016-09-05 ENCOUNTER — Ambulatory Visit: Payer: Medicare PPO | Admitting: Physical Therapy

## 2016-09-07 ENCOUNTER — Ambulatory Visit: Payer: Medicare PPO | Admitting: Physical Therapy

## 2016-09-07 VITALS — BP 162/83 | HR 55

## 2016-09-07 DIAGNOSIS — R42 Dizziness and giddiness: Secondary | ICD-10-CM | POA: Diagnosis not present

## 2016-09-07 DIAGNOSIS — R2689 Other abnormalities of gait and mobility: Secondary | ICD-10-CM | POA: Diagnosis not present

## 2016-09-07 DIAGNOSIS — M542 Cervicalgia: Secondary | ICD-10-CM | POA: Diagnosis not present

## 2016-09-07 DIAGNOSIS — R293 Abnormal posture: Secondary | ICD-10-CM | POA: Diagnosis not present

## 2016-09-07 NOTE — Therapy (Signed)
[facility] [address] [place], [place], [address] Phone: [phone]   Fax:  [phone]  Physical Therapy Treatment  Patient Details  Name: DONN WILMOT MRN: [id number] Date of Birth: Oct 11, 1945 Referring Provider: Silvestre Moment. MD  Encounter Date: 09/07/2016      PT End of Session - 09/07/16 2149    Visit Number 2   Number of Visits 8   Date for PT Re-Evaluation 10/13/16   Authorization Type [employer] medicare   PT Start Time 2703   PT Stop Time 1238   PT Time Calculation (min) 47 min      Past Medical History:  Diagnosis Date  . Acid reflux   . Barrett's esophagus   . Hyperlipemia   . Hypertension   . Migraine   . Neck pain   . Sinusitis   . Tinnitus   . Trigger finger   . Vertigo     Past Surgical History:  Procedure Laterality Date  . ABDOMINAL HYSTERECTOMY    . NASAL SINUS SURGERY    . NECK SURGERY    . TRIGGER FINGER RELEASE      Vitals:   09/07/16 1303  BP: (!) 162/83  Pulse: (!) 55        Subjective Assessment - 09/07/16 2146    Subjective Pt reports dizziness started yesterday afternoon- rates dizziness 7-8/10 at this time - states today is a "bad day" - feels worse today than she did at eval last week   Pertinent History Neck fusion in late 1990s (Dr. Trenton Gammon) -- had bulging discs.  H/o migraines that cleared with neck surgery (had for 3 days and then hangover x 2 days).     Patient Stated Goals "I'm looking for answers."   Currently in Pain? Yes   Pain Score 3    Pain Location Neck   Pain Orientation Lower;Posterior   Pain Descriptors / Indicators Aching;Tightness   Pain Type Chronic pain   Pain Onset More than a month ago   Pain Frequency Intermittent      Dynamic visual acuity test - 3 line difference at line 7 -- SVA  Line 10  Pt performed gaze stabilization in standing 60 secs horizontal and then 60 secs with vertical head turns  Smooth pursuit testing -  slight abnormality noted with horizontal smooth pursuits  Pt performed head turns 5 times with report of incr. Symptoms with this movement  Trigger point noted with palpation of Rt upper trap and suboccipital region on Rt and Lt sides  Positional testing - sit to Rt sidelying - no incr. c/o vertigo                               Sit to Lt sidelying - no incr. c/o vertigo in Lt sidelying position but c/o incr. Dizziness with return to  upright seated position                        PT Education - 09/07/16 2148    Education provided Yes   Education Details Recommend pt resume VOR exercise, Mulligan's stretch and use BioFreeze to trigger points   Person(s) Educated Patient   Methods Explanation   Comprehension Verbalized understanding             PT Long Term Goals - 08/29/16 1204      PT LONG TERM GOAL #1  Title The patient will return demonstrate gaze x 1 adaptation x 30 seconds nonstop without sensation of dizziness ("waviness"). Target date for all LTGs is 10/13/16 (see note in End of Session)   Time 4   Period Weeks     PT LONG TERM GOAL #2   Title The patient will return demonstrate cervical stretching/flexibility exercises for HEP for muscle length.   Time 4   Period Weeks     PT LONG TERM GOAL #3   Title The patient will tolerate seated head motion x 5 reps without reports of dizziness or 'waviness'.   Time 4   Period Weeks     PT LONG TERM GOAL #4   Title The patient will be further assessed for kinesthetic cervical sense.   Time 4   Period Weeks     PT LONG TERM GOAL #5   Title The patient will report neck pain at worst is < or equal to 6/10 (currently 9/10 after typing).   Time 4   Period Weeks     Additional Long Term Goals   Additional Long Term Goals Yes     PT LONG TERM GOAL #6   Title The patient will return demo proper positioning at computer to reduce postural strain/pain.   Time 4   Period Weeks               Plan  - 09/07/16 2151    Clinical Impression Statement Pt has slightly abnormal DVA with a 3 line difference; slight abnormality noted with smooth pursuit testing; pt's symptoms appear to be related to cervicogenic vertigo as well as peripheral vestibular system dysfunction   Rehab Potential Good   Clinical Impairments Affecting Rehab Potential multi-factorial symptoms (neck with possible migranous component? due to visual aura changes and decreased VOR)   PT Frequency 2x / week   PT Duration 4 weeks   PT Treatment/Interventions ADLs/Self Care Home Management;Therapeutic exercise;Therapeutic activities;Neuromuscular re-education;Balance training;Patient/family education;Gait training;Dry needling;Manual techniques   PT Next Visit Plan positional testing   PT Home Exercise Plan ENT provided [email or web address]  *will watch videos to ensure patient performing correctly for trigger point release   Consulted and Agree with Plan of Care Patient      Patient will benefit from skilled therapeutic intervention in order to improve the following deficits and impairments:  Decreased range of motion, Abnormal gait, Dizziness, Impaired vision/preception, Decreased balance, Decreased activity tolerance  Visit Diagnosis: Cervicalgia  Dizziness and giddiness     Problem List Patient Active Problem List   Diagnosis Date Noted  . Dizziness 05/10/2016    Alda Lea, PT 09/07/2016, 9:57 PM  [facility] [address] [place], [place], [address] Phone: [phone]   Fax:  [phone]  Name: SATSUKI ZILLMER MRN: [id number] Date of Birth: 1946/04/13

## 2016-09-08 ENCOUNTER — Ambulatory Visit: Payer: Medicare PPO | Admitting: Physical Therapy

## 2016-09-08 DIAGNOSIS — R2689 Other abnormalities of gait and mobility: Secondary | ICD-10-CM | POA: Diagnosis not present

## 2016-09-08 DIAGNOSIS — M542 Cervicalgia: Secondary | ICD-10-CM | POA: Diagnosis not present

## 2016-09-08 DIAGNOSIS — R293 Abnormal posture: Secondary | ICD-10-CM | POA: Diagnosis not present

## 2016-09-08 DIAGNOSIS — R42 Dizziness and giddiness: Secondary | ICD-10-CM

## 2016-09-09 NOTE — Therapy (Signed)
[facility] [address] [place], [place], [address] Phone: [phone]   Fax:  [phone]  Physical Therapy Treatment  Patient Details  Name: Nicole Herrera MRN: [id number] Date of Birth: February 23, 1946 Referring Provider: Silvestre Moment. MD  Encounter Date: 09/08/2016      PT End of Session - 09/09/16 0932    Visit Number 3   Number of Visits 8   Date for PT Re-Evaluation 10/13/16   Authorization Type [employer]   PT Start Time 0802   PT Stop Time 3151   PT Time Calculation (min) 45 min      Past Medical History:  Diagnosis Date  . Acid reflux   . Barrett's esophagus   . Hyperlipemia   . Hypertension   . Migraine   . Neck pain   . Sinusitis   . Tinnitus   . Trigger finger   . Vertigo     Past Surgical History:  Procedure Laterality Date  . ABDOMINAL HYSTERECTOMY    . NASAL SINUS SURGERY    . NECK SURGERY    . TRIGGER FINGER RELEASE      There were no vitals filed for this visit.      Subjective Assessment - 09/09/16 0929    Subjective Pt reports she feels better today than she did at yesterday's appt - states dizziness is not as bad today; did cervical stretch with towel, but has not gotten BioFreeze yet   Pertinent History Neck fusion in late 1990s (Dr. Trenton Gammon) -- had bulging discs.  H/o migraines that cleared with neck surgery (had for 3 days and then hangover x 2 days).     Patient Stated Goals "I'm looking for answers."   Currently in Pain? Yes   Pain Score 2    Pain Location Neck   Pain Orientation Right;Left   Pain Descriptors / Indicators Aching;Tightness   Pain Type Chronic pain   Pain Onset More than a month ago   Pain Frequency Intermittent       NeuroR-ed:  Rt and Lt sidelying tests (-) for c/o vertigo in sidelying positions; no nystagmus noted in either sidelying position Pt did report vertigo/light-headedness with return to upright positions from both sides Pt  reported seeing "a floater" in Lt sidelying position  Rt and Lt Dix-Hallpike performed to confirm that no BPPV was present - both tests (-) with no nystagmus and no c/o vertigo with Rt or  Lt Dix-Hallpike test  Manual therapy; gentle cervical distraction performed (pt in supine position); performed Rt and Lt passive cervical rotation with distraction Cervical PROM for Rt and Lt lateral flexion and Rt and Lt cervical rotation Soft tissue mobilization to bil. Posterior cervical musc. And bil. Upper trap regions Trigger point palpated in Rt upper trap and tenderness reported  Rt and Lt suboccipital regions; deep pressure applied to these areas  Recommended pt to use moist heat x 15" when she returns home  Reviewed cervical PROM and stretches (Mulligan's stretch) for HEP - for Rt and Lt cervical rotation stretch                           PT Long Term Goals - 08/29/16 1204      PT LONG TERM GOAL #1   Title The patient will return demonstrate gaze x 1 adaptation x 30 seconds nonstop without sensation of dizziness ("waviness"). Target date for all LTGs is 10/13/16 (see note  in End of Session)   Time 4   Period Weeks     PT LONG TERM GOAL #2   Title The patient will return demonstrate cervical stretching/flexibility exercises for HEP for muscle length.   Time 4   Period Weeks     PT LONG TERM GOAL #3   Title The patient will tolerate seated head motion x 5 reps without reports of dizziness or 'waviness'.   Time 4   Period Weeks     PT LONG TERM GOAL #4   Title The patient will be further assessed for kinesthetic cervical sense.   Time 4   Period Weeks     PT LONG TERM GOAL #5   Title The patient will report neck pain at worst is < or equal to 6/10 (currently 9/10 after typing).   Time 4   Period Weeks     Additional Long Term Goals   Additional Long Term Goals Yes     PT LONG TERM GOAL #6   Title The patient will return demo proper positioning at computer to  reduce postural strain/pain.   Time 4   Period Weeks               Plan - 09/09/16 0932    Clinical Impression Statement Pt reported less dizziness at start of session than she had at time of PT appt on previous day (09-07-16):  pt did report significant increased dizziness/light-headedness with sitting up from supine position after manual therapy for cerivcal region which included cervical manual traction; no nystagmus noted with any positional testing   Rehab Potential Good   Clinical Impairments Affecting Rehab Potential multi-factorial symptoms (neck with possible migranous component? due to visual aura changes and decreased VOR)   PT Frequency 2x / week   PT Duration 4 weeks   PT Treatment/Interventions ADLs/Self Care Home Management;Therapeutic exercise;Therapeutic activities;Neuromuscular re-education;Balance training;Patient/family education;Gait training;Dry needling;Manual techniques   PT Next Visit Plan continue manual therapy for neck    PT Home Exercise Plan ENT provided [email or web address]  *will watch videos to ensure patient performing correctly for trigger point release   Consulted and Agree with Plan of Care Patient      Patient will benefit from skilled therapeutic intervention in order to improve the following deficits and impairments:  Decreased range of motion, Abnormal gait, Dizziness, Impaired vision/preception, Decreased balance, Decreased activity tolerance  Visit Diagnosis: Dizziness and giddiness  Cervicalgia     Problem List Patient Active Problem List   Diagnosis Date Noted  . Dizziness 05/10/2016    DildayJenness Corner, PT 09/09/2016, 9:59 AM  [facility] [address] [place], [place], [address] Phone: [phone]   Fax:  [phone]  Name: Nicole Herrera MRN: [id number] Date of Birth: 05/30/1945

## 2016-09-12 ENCOUNTER — Ambulatory Visit: Payer: Medicare PPO | Admitting: Rehabilitative and Restorative Service Providers"

## 2016-09-12 DIAGNOSIS — R293 Abnormal posture: Secondary | ICD-10-CM | POA: Diagnosis not present

## 2016-09-12 DIAGNOSIS — M542 Cervicalgia: Secondary | ICD-10-CM

## 2016-09-12 DIAGNOSIS — R2689 Other abnormalities of gait and mobility: Secondary | ICD-10-CM | POA: Diagnosis not present

## 2016-09-12 DIAGNOSIS — R42 Dizziness and giddiness: Secondary | ICD-10-CM

## 2016-09-12 NOTE — Patient Instructions (Signed)
Gaze Stabilization - Tip Card  1.Target must remain in focus, not blurry, and appear stationary while head is in motion. 2.Perform exercises with small head movements (45 to either side of midline). 3.Increase speed of head motion so long as target is in focus. 4.If you wear eyeglasses, be sure you can see target through lens (therapist will give specific instructions for bifocal / progressive lenses). 5.These exercises may provoke dizziness or nausea. Work through these symptoms. If too dizzy, slow head movement slightly. Rest between each exercise. 6.Exercises demand concentration; avoid distractions. 7.For safety, perform standing exercises close to a counter, wall, corner, or next to someone.  Copyright  VHI. All rights reserved.   Gaze Stabilization - Standing Feet Apart   Feet shoulder width apart, keeping eyes on target on wall 3 feet away, tilt head down slightly and move head side to side for 30 seconds.  *Work up to tolerating 60 seconds, as able. Do 2-3 sessions per day.   Copyright  VHI. All rights reserved.     Thoracic Self-Mobilization (Supine)    With rolled towel placed lengthwise at lower ribs level, lie back on towel with arms outstretched. Hold _2 minutes. Relax. Repeat __1-2__ times per day as needed for postural stretching.  http://orth.exer.us/1001   Copyright  VHI. All rights reserved.    Thoracic Self-Mobilization Stretch (Supine)    With small rolled towel at lower ribs level, gently lie back until stretch is felt. Hold _20-30___ seconds. Relax. Repeat __3__ times per set.  Do __2__ sessions per day.  http://orth.exer.us/995   Copyright  VHI. All rights reserved.   On Elbows (Prone)    Rise up on elbows as high as possible, keeping hips on floor. Hold __20-30__ seconds. Repeat __5__ times per set. Do __2__ sessions per day.  http://orth.exer.us/93   Copyright  VHI. All rights reserved.

## 2016-09-12 NOTE — Therapy (Signed)
[facility] [address] [place], [place], [address] Phone: [phone]   Fax:  [phone]  Physical Therapy Treatment  Patient Details  Name: Nicole Herrera MRN: [id number] Date of Birth: 06-02-1945 Referring Provider: Silvestre Moment. MD  Encounter Date: 09/12/2016      PT End of Session - 09/12/16 1138    Visit Number 4   Number of Visits 8   Date for PT Re-Evaluation 10/13/16   Authorization Type [employer]   PT Start Time 1110   PT Stop Time 1150   PT Time Calculation (min) 40 min   Activity Tolerance Patient tolerated treatment well   Behavior During Therapy WFL for tasks assessed/performed      Past Medical History:  Diagnosis Date  . Acid reflux   . Barrett's esophagus   . Hyperlipemia   . Hypertension   . Migraine   . Neck pain   . Sinusitis   . Tinnitus   . Trigger finger   . Vertigo     Past Surgical History:  Procedure Laterality Date  . ABDOMINAL HYSTERECTOMY    . NASAL SINUS SURGERY    . NECK SURGERY    . TRIGGER FINGER RELEASE      There were no vitals filed for this visit.      Subjective Assessment - 09/12/16 1112    Subjective Dizziness is "there, but not severe."  She reports she gets tired/sore after therapy x 2 days.  She is doing letter exercises (gaze x 1) and felt like she could keep eyes on letter.  She notes vision was clearer with head motion.   She is c/o suboccipital pain today/ with tightness.    Pertinent History Neck fusion in late 1990s (Dr. Trenton Gammon) -- had bulging discs.  H/o migraines that cleared with neck surgery (had for 3 days and then hangover x 2 days).     Patient Stated Goals "I'm looking for answers."   Currently in Pain? Yes   Pain Score 6   painful because it is tight   Pain Location Neck   Pain Orientation Right;Left   Pain Descriptors / Indicators Tightness   Pain Type Chronic pain   Pain Onset More than a month ago   Pain Frequency  Intermittent   Aggravating Factors  exercise   Pain Relieving Factors sleeping, exercises help                         [facility] Adult PT Treatment/Exercise - 09/12/16 1129      Neuro Re-ed    Neuro Re-ed Details  Cervical kinesthetic exercises performing with foveal lenses donned to block all fixation except 38mm square on R eye (patient worse glasses beneat lenses).  Had patient find target, close eyes +complete head rotation right to left and then refind target with eyes closed.  Opened eyes to determine if accurately able to find target.  *Patient usually to left of target with dec'd kinesthetic sense.     Exercises   Exercises Shoulder;Neck     Neck Exercises: Prone   W Back 10 reps   Other Prone Exercise prone on elbows with thoracic/cervical gentle extension.     Shoulder Exercises: Supine   Other Supine Exercises self mobilization thoracic spine with towel roll parallel and perpendicular to spine (to stretch chest musculature and also mobilize thoracic spine).      Shoulder Exercises: Prone   Retraction AROM;10 reps  Retraction Limitations lift and reach to shoes for scapular depression     Manual Therapy   Manual Therapy Soft tissue mobilization;Manual Traction;Passive ROM;Joint mobilization   Manual therapy comments In supine performed cervical distraction, soft tissue mobilization.  In prone performed joint mobilization for thoracic spine   Joint Mobilization thoracic spine Posterior>anterior grade II-III joint mobility   Soft tissue mobilization suboccipital release, scalene trigger point massage left and right.   Passive ROM overpressure for levator stretch supine   Manual Traction gentle manual distraction x 30 second holds x 5 reps                PT Education - 09/12/16 1456    Education provided Yes   Education Details HEP: reviewed gaze, added thoracic extension for postural upright (see ex in patient instructions)   Person(s) Educated  Patient   Methods Explanation;Demonstration;Handout   Comprehension Verbalized understanding;Returned demonstration             PT Long Term Goals - 08/29/16 1204      PT LONG TERM GOAL #1   Title The patient will return demonstrate gaze x 1 adaptation x 30 seconds nonstop without sensation of dizziness ("waviness"). Target date for all LTGs is 10/13/16 (see note in End of Session)   Time 4   Period Weeks     PT LONG TERM GOAL #2   Title The patient will return demonstrate cervical stretching/flexibility exercises for HEP for muscle length.   Time 4   Period Weeks     PT LONG TERM GOAL #3   Title The patient will tolerate seated head motion x 5 reps without reports of dizziness or 'waviness'.   Time 4   Period Weeks     PT LONG TERM GOAL #4   Title The patient will be further assessed for kinesthetic cervical sense.   Time 4   Period Weeks     PT LONG TERM GOAL #5   Title The patient will report neck pain at worst is < or equal to 6/10 (currently 9/10 after typing).   Time 4   Period Weeks     Additional Long Term Goals   Additional Long Term Goals Yes     PT LONG TERM GOAL #6   Title The patient will return demo proper positioning at computer to reduce postural strain/pain.   Time 4   Period Weeks               Plan - 09/12/16 1458    Clinical Impression Statement With cervical kinesthetic exercises, the patient misses target by approximately 10 degrees to the left of targets.  She notes discomfort in thoracic spine that she feels contributes to neck tightness at the computer.  PT assessed and she has significantly reduced posterior to anterior joint mobility in T4-T7.  PT initiated HEP to address, along with VOR x 1 viewing.  Continue to progress to LTGs.    PT Treatment/Interventions ADLs/Self Care Home Management;Therapeutic exercise;Therapeutic activities;Neuromuscular re-education;Balance training;Patient/family education;Gait training;Dry needling;Manual  techniques   PT Next Visit Plan manual therapy, kinesthetic cervical/visual exercises, postural training, sitting at computer patient education/posture   PT Home Exercise Plan ENT provided [email or web address]  *will watch videos to ensure patient performing correctly for trigger point release   Consulted and Agree with Plan of Care Patient      Patient will benefit from skilled therapeutic intervention in order to improve the following deficits and impairments:  Decreased range of motion, Abnormal gait, Dizziness,  Impaired vision/preception, Decreased balance, Decreased activity tolerance  Visit Diagnosis: Dizziness and giddiness  Cervicalgia  Abnormal posture  Other abnormalities of gait and mobility     Problem List Patient Active Problem List   Diagnosis Date Noted  . Dizziness 05/10/2016    WEAVER,CHRISTINA, PT 09/12/2016, 3:06 PM  [facility] [address] [place], [place], [address] Phone: [phone]   Fax:  [phone]  Name: Nicole Herrera MRN: [id number] Date of Birth: 07-26-1945

## 2016-09-14 ENCOUNTER — Ambulatory Visit: Payer: Medicare PPO | Admitting: Rehabilitative and Restorative Service Providers"

## 2016-09-14 DIAGNOSIS — R293 Abnormal posture: Secondary | ICD-10-CM

## 2016-09-14 DIAGNOSIS — M542 Cervicalgia: Secondary | ICD-10-CM | POA: Diagnosis not present

## 2016-09-14 DIAGNOSIS — R2689 Other abnormalities of gait and mobility: Secondary | ICD-10-CM | POA: Diagnosis not present

## 2016-09-14 DIAGNOSIS — R42 Dizziness and giddiness: Secondary | ICD-10-CM

## 2016-09-14 NOTE — Patient Instructions (Addendum)
Computer Work    Position work to Programmer, multimedia. Use proper work  (may have to place laptop on 2-3 large books)and seat height. Keep shoulders back and down, wrists straight, and elbows at right angles. Use chair that provides full back support. Add a umbar roll to support low back.   Copyright  VHI. All rights reserved.   Oculomotor: Saccades    - place 2 targets about 8-12" apart on the back of a door -align head and eyes to right sided target -close your eyes and move your head side to side and return to where you think target is -open your eyes to see if you aligned back to the target  -repeat 5 times with right target  -repeat with the left target  Copyright  VHI. All rights reserved.

## 2016-09-14 NOTE — Therapy (Signed)
[facility] [address] [place], [place], [address] Phone: [phone]   Fax:  [phone]  Physical Therapy Treatment  Patient Details  Name: Nicole Herrera MRN: [id number] Date of Birth: April 14, 1946 Referring Provider: Silvestre Moment. MD  Encounter Date: 09/14/2016      PT End of Session - 09/14/16 1331    Visit Number 5   Number of Visits 8   Date for PT Re-Evaluation 10/13/16   Authorization Type [employer] medicare   PT Start Time 1324   PT Stop Time 1405   PT Time Calculation (min) 43 min   Activity Tolerance Patient tolerated treatment well   Behavior During Therapy WFL for tasks assessed/performed      Past Medical History:  Diagnosis Date  . Acid reflux   . Barrett's esophagus   . Hyperlipemia   . Hypertension   . Migraine   . Neck pain   . Sinusitis   . Tinnitus   . Trigger finger   . Vertigo     Past Surgical History:  Procedure Laterality Date  . ABDOMINAL HYSTERECTOMY    . NASAL SINUS SURGERY    . NECK SURGERY    . TRIGGER FINGER RELEASE      There were no vitals filed for this visit.      Subjective Assessment - 09/14/16 1322    Subjective The patient reports she had significant back pain yesterday morning when she got up (the whole back).  She only did letter exercise yesterday and feels it keeps improving.   The patient notes she historically wakes up with back tightness/discomfort (5/10).     Pertinent History Neck fusion in late 1990s C5-C6* (Dr. Trenton Gammon) -- had bulging discs.  H/o migraines that cleared with neck surgery (had for 3 days and then hangover x 2 days).     Patient Stated Goals "I'm looking for answers."   Currently in Pain? Yes   Pain Score 8    Pain Location Back   Pain Orientation Mid;Lower   Pain Descriptors / Indicators Tightness   Pain Type Chronic pain   Pain Onset More than a month ago   Pain Frequency Intermittent   Aggravating Factors  exercise   Pain Relieving Factors gentle movement, rest                         [facility] Adult PT Treatment/Exercise - 09/14/16 1455      Self-Care   Self-Care Other Self-Care Comments   Other Self-Care Comments  computer set-up; posture in computer chair, height of work space, see handout for patient instructions.     Neuro Re-ed    Neuro Re-ed Details  Cervical kinesthetic exercises performing with foveal lenses donned to block all fixation except 42mm square on R eye (patient worse glasses beneat lenses).  Had patient find target, close eyes +complete head rotation right to left and then refind target with eyes closed.  Opened eyes to determine if accurately able to find target.  Also performed standing with target fixation (with foveal lenses) then turned body R<>L in standing and sitting on a stool.  Patient needs tactile cues to avoid neck sidebending with movement.      Manual Therapy   Manual Therapy Passive ROM;Soft tissue mobilization;Joint mobilization   Manual therapy comments In supine and prone for thoracic mobility   Joint Mobilization thoracic spine Posterior>anterior grade II-III joint mobility   Soft tissue mobilization suboccipital  release   Passive ROM upper cervical rotation with neck flexed to 30 degrees                 PT Education - 09/14/16 1418    Education provided Yes   Education Details HEP: computer set up with posture emphasis, imaginary targets (modified from saccade picture)   Person(s) Educated Patient   Methods Explanation;Demonstration;Handout   Comprehension Verbalized understanding;Returned demonstration             PT Long Term Goals - 08/29/16 1204      PT LONG TERM GOAL #1   Title The patient will return demonstrate gaze x 1 adaptation x 30 seconds nonstop without sensation of dizziness ("waviness"). Target date for all LTGs is 10/13/16 (see note in End of Session)   Time 4   Period Weeks     PT LONG TERM GOAL #2   Title The  patient will return demonstrate cervical stretching/flexibility exercises for HEP for muscle length.   Time 4   Period Weeks     PT LONG TERM GOAL #3   Title The patient will tolerate seated head motion x 5 reps without reports of dizziness or 'waviness'.   Time 4   Period Weeks     PT LONG TERM GOAL #4   Title The patient will be further assessed for kinesthetic cervical sense.   Time 4   Period Weeks     PT LONG TERM GOAL #5   Title The patient will report neck pain at worst is < or equal to 6/10 (currently 9/10 after typing).   Time 4   Period Weeks     Additional Long Term Goals   Additional Long Term Goals Yes     PT LONG TERM GOAL #6   Title The patient will return demo proper positioning at computer to reduce postural strain/pain.   Time 4   Period Weeks               Plan - 09/14/16 1443    Clinical Impression Statement The patient has increased discomfort/pain/tightness in back after Monday's session.  PT recommended she modify HEP by waiting 2-3 days to allow symtpoms to settle, then add one thoracic exercise to routine each day to ensure not getting sore from exercises.  Patient has decreased accuracy with kinesthetic neck/head motion target training.   Continue working towards [employer].    PT Treatment/Interventions ADLs/Self Care Home Management;Therapeutic exercise;Therapeutic activities;Neuromuscular re-education;Balance training;Patient/family education;Gait training;Dry needling;Manual techniques   PT Next Visit Plan Dry needling, manual therapy, kinesthetic cervical/visual exercises   PT Home Exercise Plan ENT provided [email or web address]  *will watch videos to ensure patient performing correctly for trigger point release   Consulted and Agree with Plan of Care Patient      Patient will benefit from skilled therapeutic intervention in order to improve the following deficits and impairments:  Decreased range of motion, Abnormal gait, Dizziness,  Impaired vision/preception, Decreased balance, Decreased activity tolerance  Visit Diagnosis: Dizziness and giddiness  Cervicalgia  Abnormal posture  Other abnormalities of gait and mobility     Problem List Patient Active Problem List   Diagnosis Date Noted  . Dizziness 05/10/2016    WEAVER,CHRISTINA , PT 09/14/2016, 2:59 PM  [facility] [address] [place], [place], [address] Phone: [phone]   Fax:  [phone]  Name: ROBY SPALLA MRN: [id number] Date of Birth: Aug 31, 1945

## 2016-09-15 ENCOUNTER — Encounter: Payer: Medicare PPO | Admitting: Rehabilitative and Restorative Service Providers"

## 2016-09-16 DIAGNOSIS — H40013 Open angle with borderline findings, low risk, bilateral: Secondary | ICD-10-CM | POA: Diagnosis not present

## 2016-09-16 DIAGNOSIS — H2513 Age-related nuclear cataract, bilateral: Secondary | ICD-10-CM | POA: Diagnosis not present

## 2016-09-19 ENCOUNTER — Encounter: Payer: Medicare PPO | Admitting: Rehabilitative and Restorative Service Providers"

## 2016-09-19 ENCOUNTER — Ambulatory Visit: Payer: Medicare PPO | Admitting: Rehabilitative and Restorative Service Providers"

## 2016-09-19 DIAGNOSIS — M542 Cervicalgia: Secondary | ICD-10-CM | POA: Diagnosis not present

## 2016-09-19 DIAGNOSIS — R293 Abnormal posture: Secondary | ICD-10-CM

## 2016-09-19 DIAGNOSIS — R42 Dizziness and giddiness: Secondary | ICD-10-CM

## 2016-09-19 DIAGNOSIS — R2689 Other abnormalities of gait and mobility: Secondary | ICD-10-CM | POA: Diagnosis not present

## 2016-09-19 NOTE — Patient Instructions (Signed)
Gaze Stabilization - Tip Card  1.Target must remain in focus, not blurry, and appear stationary while head is in motion. 2.Perform exercises with small head movements (45 to either side of midline). 3.Increase speed of head motion so long as target is in focus. 4.If you wear eyeglasses, be sure you can see target through lens (therapist will give specific instructions for bifocal / progressive lenses). 5.These exercises may provoke dizziness or nausea. Work through these symptoms. If too dizzy, slow head movement slightly. Rest between each exercise. 6.Exercises demand concentration; avoid distractions. 7.For safety, perform standing exercises close to a counter, wall, corner, or next to someone.  Copyright  VHI. All rights reserved.   Gaze Stabilization - Standing Feet Apart   Feet shoulder width apart, keeping eyes on target on wall 3 feet away, tilt head down slightly and move head side to side for 30 seconds.  *Work up to tolerating 60 seconds, as able. Do 2-3 sessions per day.   Copyright  VHI. All rights reserved.     Thoracic Self-Mobilization (Supine)    With rolled towel placed lengthwise at lower ribs level, lie back on towel with arms outstretched. Hold _2 minutes. Relax. Repeat __1-2__ times per day as needed for postural stretching.  http://orth.exer.us/1001   Copyright  VHI. All rights reserved.    Thoracic Self-Mobilization Stretch (Supine)    With small rolled towel at lower ribs level, gently lie back until stretch is felt. Hold _20-30___ seconds. Relax. Repeat __3__ times per set.  Do __2__ sessions per day.  http://orth.exer.us/995   Copyright  VHI. All rights reserved.   On Elbows (Prone)    Rise up on elbows as high as possible, keeping hips on floor. Hold __20-30__ seconds. Repeat __5__ times per set. Do __2__ sessions per day.  http://orth.exer.us/93   Copyright  VHI. All rights reserved.  Oculomotor: Saccades    -  place 2 targets about 8-12" apart on the back of a door -align head and eyes to right sided target -close your eyes and move your head side to side and return to where you think target is -open your eyes to see if you aligned back to the target  -repeat 5 times with right target  -repeat with the left target   Head Press With Chin Tuck    Tuck chin SLIGHTLY toward chest, keep mouth closed. Feel weight on back of head. Increase weight by pressing head down. Hold __3_ seconds. Relax. Repeat _10__ times. Surface: bed  Copyright  VHI. All rights reserved.   Flexion: Lateral, Lying - Resist (Hand)    Don't use your hand for resistance.  Lie down on your side with a towel roll and lift your head off of the towel slowly.  Repeat 10 times. Do _1___ sets.   http://st.exer.us/463   Copyright  VHI. All rights reserved.

## 2016-09-19 NOTE — Therapy (Signed)
[facility] [address] [place], [place], [address] Phone: [phone]   Fax:  [phone]  Physical Therapy Treatment  Patient Details  Name: Nicole Herrera MRN: [id number] Date of Birth: 08/16/1945 Referring Provider: Silvestre Moment. MD  Encounter Date: 09/19/2016      PT End of Session - 09/19/16 1048    Visit Number 6   Number of Visits 8   Date for PT Re-Evaluation 10/13/16   Authorization Type [employer] medicare   PT Start Time 1022   PT Stop Time 1102   PT Time Calculation (min) 40 min   Activity Tolerance Patient tolerated treatment well   Behavior During Therapy WFL for tasks assessed/performed      Past Medical History:  Diagnosis Date  . Acid reflux   . Barrett's esophagus   . Hyperlipemia   . Hypertension   . Migraine   . Neck pain   . Sinusitis   . Tinnitus   . Trigger finger   . Vertigo     Past Surgical History:  Procedure Laterality Date  . ABDOMINAL HYSTERECTOMY    . NASAL SINUS SURGERY    . NECK SURGERY    . TRIGGER FINGER RELEASE      There were no vitals filed for this visit.      Subjective Assessment - 09/19/16 1026    Subjective The patient reports she had a good weekend with less fogginess reporting "moments in the days where I felt I was almost normal".  Symptoms are not staying as long as they were.  She did not do the exercises due to being busy since last session (not done daily, intermittent instead).  Looking down can aggravate the wave of dizziness, tinnitus is less.    Pertinent History Neck fusion in late 1990s C5-C6* (Dr. Trenton Gammon) -- had bulging discs.  H/o migraines that cleared with neck surgery (had for 3 days and then hangover x 2 days).     Patient Stated Goals "I'm looking for answers."   Currently in Pain? No/denies  patient took aleve   Pain Score --  was 5-6/10 upon waking today, alleve helped                Vestibular Assessment -  09/19/16 1048      Orthostatics   BP supine (x 5 minutes) 130/86  hard to hear   BP standing (after 1 minute) 120/80  hard to hear with manual cuff + stethescope   BP standing (after 3 minutes) 120/82   Orthostatics Comment patient noted a posterior pulling sensation when rising                 [facility] Adult PT Treatment/Exercise - 09/19/16 1031      Neuro Re-ed    Neuro Re-ed Details  patient rates "fogginess" is there today at a 4/10.       Exercises   Exercises Other Exercises   Other Exercises  Standing trunk flexion/extension and standing trunk rotation for general flexibility.  Supine trunk rotation openin gup with contralateral UE and rotating neck away from knees for greater stretch.      Neck Exercises: Seated   Other Seated Exercise Levator stretch seated R and L sides.      Neck Exercises: Supine   Neck Retraction 10 reps;3 secs   Neck Retraction Limitations patient feels in her low back initially, recommended she use less force to decrease lumbar activation   Capital Flexion  10 reps   Capital Flexion Limitations lifting head off of pillow against gravity for strengthening     Neck Exercises: Sidelying   Lateral Flexion Right;Left;10 reps   Lateral Flexion Limitations Lifting against gravity for neck strengthening     Neck Exercises: Prone   Axial Exentsion 5 reps   Axial Extension Limitations attempted while prone on elbows, however patient is using extension versus chin tuck and PT modified to be supine.                      PT Long Term Goals - 08/29/16 1204      PT LONG TERM GOAL #1   Title The patient will return demonstrate gaze x 1 adaptation x 30 seconds nonstop without sensation of dizziness ("waviness"). Target date for all LTGs is 10/13/16 (see note in End of Session)   Time 4   Period Weeks     PT LONG TERM GOAL #2   Title The patient will return demonstrate cervical stretching/flexibility exercises for HEP for muscle length.    Time 4   Period Weeks     PT LONG TERM GOAL #3   Title The patient will tolerate seated head motion x 5 reps without reports of dizziness or 'waviness'.   Time 4   Period Weeks     PT LONG TERM GOAL #4   Title The patient will be further assessed for kinesthetic cervical sense.   Time 4   Period Weeks     PT LONG TERM GOAL #5   Title The patient will report neck pain at worst is < or equal to 6/10 (currently 9/10 after typing).   Time 4   Period Weeks     Additional Long Term Goals   Additional Long Term Goals Yes     PT LONG TERM GOAL #6   Title The patient will return demo proper positioning at computer to reduce postural strain/pain.   Time 4   Period Weeks               Plan - 09/19/16 1443    Clinical Impression Statement The patient is noticing some decrease in symptoms of "fogginess" and improved mobility in neck/upper back.  She continues today with tightness noted at L levator scapulae.  Patient to be assessed at next session for dry needling to reduce muscle guarding and neck tesnion/tightness.    PT Treatment/Interventions ADLs/Self Care Home Management;Therapeutic exercise;Therapeutic activities;Neuromuscular re-education;Balance training;Patient/family education;Gait training;Dry needling;Manual techniques   PT Next Visit Plan Dry needling, manual therapy, postural stability, kinesthetic cervical/visual exercises   Consulted and Agree with Plan of Care Patient      Patient will benefit from skilled therapeutic intervention in order to improve the following deficits and impairments:  Decreased range of motion, Abnormal gait, Dizziness, Impaired vision/preception, Decreased balance, Decreased activity tolerance  Visit Diagnosis: Dizziness and giddiness  Cervicalgia  Abnormal posture  Other abnormalities of gait and mobility     Problem List Patient Active Problem List   Diagnosis Date Noted  . Dizziness 05/10/2016    WEAVER,CHRISTINA,  PT 09/19/2016, 2:44 PM  [facility] [address] [place], [place], [address] Phone: [phone]   Fax:  [phone]  Name: Nicole Herrera MRN: [id number] Date of Birth: December 11, 1945

## 2016-09-22 ENCOUNTER — Encounter: Payer: Medicare PPO | Admitting: Rehabilitative and Restorative Service Providers"

## 2016-09-22 ENCOUNTER — Ambulatory Visit: Payer: Medicare PPO | Attending: Family Medicine | Admitting: Physical Therapy

## 2016-09-22 DIAGNOSIS — M542 Cervicalgia: Secondary | ICD-10-CM

## 2016-09-22 DIAGNOSIS — R293 Abnormal posture: Secondary | ICD-10-CM | POA: Insufficient documentation

## 2016-09-22 DIAGNOSIS — R2689 Other abnormalities of gait and mobility: Secondary | ICD-10-CM | POA: Insufficient documentation

## 2016-09-22 DIAGNOSIS — R42 Dizziness and giddiness: Secondary | ICD-10-CM | POA: Diagnosis not present

## 2016-09-22 NOTE — Patient Instructions (Signed)
   Voncille Lo, PT Certified Exercise Expert for the Aging Adult  09/22/16 11:59 AM Phone: [phone] Fax: [phone]

## 2016-09-22 NOTE — Therapy (Signed)
[facility] [place], [place], [address] Phone: [phone]   Fax:  [phone]  Physical Therapy Treatment  Patient Details  Name: Nicole Herrera MRN: [id number] Date of Birth: [birth year] Referring Provider: Silvestre Herrera. MD  Encounter Date: 09/22/2016      PT End of Session - 09/22/16 1139    Visit Number 7   Number of Visits 8   Date for PT Re-Evaluation 10/13/16   Authorization Type [employer] medicare   PT Start Time 1140   PT Stop Time 1235   PT Time Calculation (min) 55 min   Activity Tolerance Patient tolerated treatment well   Behavior During Therapy WFL for tasks assessed/performed      Past Medical History:  Diagnosis Date  . Acid reflux   . Barrett's esophagus   . Hyperlipemia   . Hypertension   . Migraine   . Neck pain   . Sinusitis   . Tinnitus   . Trigger finger   . Vertigo     Past Surgical History:  Procedure Laterality Date  . ABDOMINAL HYSTERECTOMY    . NASAL SINUS SURGERY    . NECK SURGERY    . TRIGGER FINGER RELEASE      There were no vitals filed for this visit.      Subjective Assessment - 09/22/16 1144    Subjective Pt reports that she is left handed and her left neck/shoulder hurts,  I have been treated by Nicole Herrera for Vertigo. today I want to try trigger point dry needling. I have come and go headaches over my eyes with sharp pain and neck pain is sharp   Pertinent History Neck fusion in late 1990s C5-C6* (Dr. Trenton Herrera) -- had bulging discs.  H/o migraines that cleared with neck surgery (had for 3 days and then hangover x 2 days).     Patient Stated Goals "I'm looking for answers."   Currently in Pain? Yes   Pain Location Neck   Pain Orientation Right;Left  left with more marked pain   Pain Descriptors / Indicators Tightness   Pain Type Chronic pain   Pain Onset More than a month ago   Pain Frequency Intermittent            [facility] PT Assessment -  09/22/16 1153      ROM / Strength   AROM / PROM / Strength AROM     AROM   Cervical Flexion 48   Cervical Extension 46   Cervical - Right Side Bend 39   Cervical - Left Side Bend 35   Cervical - Right Rotation 55   Cervical - Left Rotation 50     Palpation   Palpation comment marked tightness of left suboccipital and left scalene, SCM and upper trap                     [facility] Adult PT Treatment/Exercise - 09/22/16 1140      Moist Heat Therapy   Number Minutes Moist Heat 10 Minutes   Moist Heat Location Cervical     Manual Therapy   Manual Therapy Soft tissue mobilization   Manual therapy comments use of [place] taple for bil upper trap inhibition and postural awareness   Soft tissue mobilization bil upper trap, levator, scalenes, SCM Left > Right     Neck Exercises: Stretches   Upper Trapezius Stretch 3 reps;30 seconds   Levator Stretch 3 reps;30 seconds   Neck  Stretch 5 reps;10 seconds          Trigger Point Dry Needling - 09/22/16 1203    Consent Given? Yes   Education Handout Provided Yes   Muscles Treated Upper Body Upper trapezius;Levator scapulae;Suboccipitals muscle group  bil left > right, scalenes, SCM    Muscles Treated Lower Body --  left C-3 twitch and pt states no tinnitus post TDN   Upper Trapezius Response Twitch reponse elicited;Palpable increased muscle length   SubOccipitals Response Twitch response elicited;Palpable increased muscle length  left only   Levator Scapulae Response Twitch response elicited;Palpable increased muscle length              PT Education - 09/22/16 1159    Education provided Yes   Education Details Education on Trigger point dry needling and precautians and neck stretches given as handouts due to printing problems   Person(s) Educated Patient   Methods Explanation;Demonstration;Verbal cues;Handout;Tactile cues   Comprehension Verbalized understanding;Returned demonstration             PT  Long Term Goals - 08/29/16 1204      PT LONG TERM GOAL #1   Title The patient will return demonstrate gaze x 1 adaptation x 30 seconds nonstop without sensation of dizziness ("waviness"). Target date for all LTGs is 10/13/16 (see note in End of Session)   Time 4   Period Weeks     PT LONG TERM GOAL #2   Title The patient will return demonstrate cervical stretching/flexibility exercises for HEP for muscle length.   Time 4   Period Weeks     PT LONG TERM GOAL #3   Title The patient will tolerate seated head motion x 5 reps without reports of dizziness or 'waviness'.   Time 4   Period Weeks     PT LONG TERM GOAL #4   Title The patient will be further assessed for kinesthetic cervical sense.   Time 4   Period Weeks     PT LONG TERM GOAL #5   Title The patient will report neck pain at worst is < or equal to 6/10 (currently 9/10 after typing).   Time 4   Period Weeks     Additional Long Term Goals   Additional Long Term Goals Yes     PT LONG TERM GOAL #6   Title The patient will return demo proper positioning at computer to reduce postural strain/pain.   Time 4   Period Weeks               Plan - 09/22/16 1242    Clinical Impression Statement [name] presents with marked bil tightness of neck musculature, Left> right.  Pt consented to trigger point dry needling and was given handout, information and educated on precautians and aftercare.  Pt was closely monitiored throughout session.  Pt with increased AROM in all planes post TDN.  Pt also reports no tinnitus post TDN of C-3 erector spinae on left.   Pt  is pleased with first session.   Rehab Potential Good   Clinical Impairments Affecting Rehab Potential multi-factorial symptoms (neck with possible migranous component? due to visual aura changes and decreased VOR)   PT Frequency 2x / week   PT Duration 4 weeks   PT Treatment/Interventions ADLs/Self Care Home Management;Therapeutic exercise;Therapeutic  activities;Neuromuscular re-education;Balance training;Patient/family education;Gait training;Dry needling;Manual techniques   PT Next Visit Plan Dry needling, manual therapy, postural stability, kinesthetic cervical/visual exercises   PT Home Exercise Plan ENT provided [email or web address]  *  will watch videos to ensure patient performing correctly for trigger point release, neck stretches, Upper trap , levator and neck retraction   Consulted and Agree with Plan of Care Patient      Patient will benefit from skilled therapeutic intervention in order to improve the following deficits and impairments:  Decreased range of motion, Abnormal gait, Dizziness, Impaired vision/preception, Decreased balance, Decreased activity tolerance  Visit Diagnosis: Dizziness and giddiness  Cervicalgia  Abnormal posture  Other abnormalities of gait and mobility     Problem List Patient Active Problem List   Diagnosis Date Noted  . Dizziness 05/10/2016   Voncille Lo, PT Certified Exercise Expert for the Aging Adult  09/22/16 12:47 PM Phone: [phone] Fax: [facility] [address] [place], [place], [address] Phone: [phone]   Fax:  [phone]  Name: Nicole Herrera MRN: [id number] Date of Birth: Dec 29, 1945

## 2016-09-26 ENCOUNTER — Encounter: Payer: Medicare PPO | Admitting: Physical Therapy

## 2016-09-26 ENCOUNTER — Telehealth: Payer: Self-pay | Admitting: Rehabilitative and Restorative Service Providers"

## 2016-09-26 NOTE — Telephone Encounter (Signed)
PT called the patient and discussed benefits of dry needling.  She reports she feels more "normal", no tinnitus, and no fogginess.    She is able to do her vestibular exercises well.  PT and patient discussed need for our visit on Thursday. She wants to continue with treatment at [facility] per discussion as this seems to offer most benefit (dry needling, focusing on neck stretching, exercise).  Patient to let treating PT know or call our clinic if future vestibular needs need to be addressed.  WEAVER,CHRISTINA, PT

## 2016-09-27 ENCOUNTER — Encounter: Payer: Self-pay | Admitting: Physical Therapy

## 2016-09-27 ENCOUNTER — Ambulatory Visit: Payer: Medicare PPO | Admitting: Physical Therapy

## 2016-09-27 DIAGNOSIS — R2689 Other abnormalities of gait and mobility: Secondary | ICD-10-CM

## 2016-09-27 DIAGNOSIS — R293 Abnormal posture: Secondary | ICD-10-CM | POA: Diagnosis not present

## 2016-09-27 DIAGNOSIS — M542 Cervicalgia: Secondary | ICD-10-CM | POA: Diagnosis not present

## 2016-09-27 DIAGNOSIS — R42 Dizziness and giddiness: Secondary | ICD-10-CM

## 2016-09-27 NOTE — Patient Instructions (Signed)
1)  Step with right foot and elbow by side , rotate trunk thinking of squeezing shoulder blades and bring elbow toward back pocket.  Do 2 0 x per shoulder 2)  Bend knees and roll shoulders forward and then straighten with soft knees and bring shoulders back x 20 x 3 at a time   Extension (Isometric)    Place left bent elbow and back of arm against wall. Press elbow against wall. Make sure arm is straight, wrist into wall, Hold __3__ seconds. Repeat __20__ times. Do __1-2__ sessions per day. Engaging lower trapezius as shown in clinic. Lowering shoulder into back pocket. Remember http://gt2.exer.us/112   Copyright  VHI. All rights reserved.  Voncille Lo, PT Certified Exercise Expert for the Aging Adult  09/27/16 9:11 AM Phone: [phone] Fax: [phone]

## 2016-09-27 NOTE — Therapy (Signed)
[place], [place], [address] Phone: [phone]   Fax:  [phone]  Physical Therapy Treatment/Recertification  Patient Details  Name: Nicole Herrera MRN: [id number] Date of Birth: 04-23-1946 Referring Provider: Silvestre Moment. MD  Encounter Date: 09/27/2016      PT End of Session - 09/27/16 0853    Visit Number 8   Number of Visits 12   Date for PT Re-Evaluation 10/25/16   Authorization Type [employer] medicare   PT Start Time [phone]   PT Stop Time 0940   PT Time Calculation (min) 53 min   Activity Tolerance Patient tolerated treatment well   Behavior During Therapy [facility] for tasks assessed/performed      Past Medical History:  Diagnosis Date  . Acid reflux   . Barrett's esophagus   . Hyperlipemia   . Hypertension   . Migraine   . Neck pain   . Sinusitis   . Tinnitus   . Trigger finger   . Vertigo     Past Surgical History:  Procedure Laterality Date  . ABDOMINAL HYSTERECTOMY    . NASAL SINUS SURGERY    . NECK SURGERY    . TRIGGER FINGER RELEASE      There were no vitals filed for this visit.      Subjective Assessment - 09/27/16 0911    Subjective Pt reports feeling better and I do not have the foggy feeling . I do not have the tinnitius in left ear and minore in the right.  I am a 5-6/10 today. I am pleased.  After treatment Pt reports feeling dizzy and asked to sit for a while before leaving.     Pertinent History Neck fusion in late 1990s C5-C6* (Dr. Trenton Gammon) -- had bulging discs.  H/o migraines that cleared with neck surgery (had for 3 days and then hangover x 2 days).     Patient Stated Goals "I'm looking for answers."   Currently in Pain? Yes   Pain Score 5    Pain Location Neck   Pain Orientation Right;Left   Pain Descriptors / Indicators Tightness   Pain Type Chronic pain   Pain Onset More than a month ago   Pain Frequency Intermittent            [facility] PT  Assessment - 09/27/16 0932      Observation/Other Assessments   Focus on Therapeutic Outcomes (FOTO)  FOTO intake 46% limitation 54%  predicted 42%     ROM / Strength   AROM / PROM / Strength AROM     AROM   Overall AROM Comments Pt with bil scapular dyskinesis noted and would benefit from continued strengthening   AROM Assessment Site Cervical   Cervical Flexion 40   Cervical Extension 45   Cervical - Right Side Bend 32   Cervical - Left Side Bend 30   Cervical - Right Rotation 55   Cervical - Left Rotation 50     Palpation   Palpation comment marked tightness of left suboccipital and left scalene, SCM and upper trap                     [facility] Adult PT Treatment/Exercise - 09/27/16 0913      Shoulder Exercises: Standing   Other Standing Exercises low trap row against wall bil x 20 with VC and TC,  standing and weight shift with left elbow low trap engagement x 20 to right and  left,  standing mini lunge and return with bil shoulder retraction and lower trap engagement 20 x,     Moist Heat Therapy   Number Minutes Moist Heat 15 Minutes   Moist Heat Location Cervical     Manual Therapy   Manual Therapy Soft tissue mobilization   Manual therapy comments suboccipital release   Soft tissue mobilization bil upper trap, levator, scalenes, SCM Left > Right          Trigger Point Dry Needling - 09/27/16 1142    Consent Given? Yes   Education Handout Provided No  previously given   Muscles Treated Upper Body Levator scapulae;Upper trapezius;Oblique capitus;Suboccipitals muscle group   Muscles Treated Lower Body --  C-3 bil , left tighter and with marked response   Upper Trapezius Response Twitch reponse elicited;Palpable increased muscle length  bil   Oblique Capitus Response Twitch response elicited;Palpable increased muscle length  bil   SubOccipitals Response Twitch response elicited;Palpable increased muscle length  bil   Levator Scapulae Response Twitch  response elicited;Palpable increased muscle length  bil              PT Education - 09/27/16 0911    Education provided Yes   Education Details intial scapular stabilization exercises   Person(s) Educated Patient   Methods Explanation;Demonstration;Tactile cues;Verbal cues;Handout   Comprehension Verbalized understanding;Returned demonstration;Verbal cues required;Tactile cues required;Need further instruction             PT Long Term Goals - 09/27/16 0915      PT LONG TERM GOAL #1   Title The patient will return demonstrate gaze x 1 adaptation x 30 seconds nonstop without sensation of dizziness ("waviness"). Target date for all LTGs is 10/13/16 (see note in End of Session)   Time 4   Period Weeks   Status Partially Met     PT LONG TERM GOAL #2   Title The patient will return demonstrate cervical stretching/flexibility exercises for HEP for muscle length.   Time 4   Period Weeks   Status Achieved     PT LONG TERM GOAL #3   Title The patient will tolerate seated head motion x 5 reps without reports of dizziness or 'waviness'.   Time 4   Period Weeks   Status Achieved     PT LONG TERM GOAL #4   Title The patient will be further assessed for kinesthetic cervical sense.   Time 4   Period Weeks   Status Achieved     PT LONG TERM GOAL #5   Title The patient will report neck pain at worst is < or equal to 6/10 (currently 9/10 after typing).   Baseline currently 5-6/10 today and causes distraction to daily activities Target 10-25-16   Time 4   Period Weeks   Status On-going     PT LONG TERM GOAL #6   Title The patient will return demo proper positioning at computer to reduce postural strain/pain.   Baseline reinforcing posture and scapular stabilization Target 10-25-16   Time 4   Period Weeks   Status On-going     PT LONG TERM GOAL #7   Title Pt will recieve at least 5-6 maximal TDN treatment for palpable lengthening of cervical musculature and utilize stretches  and self myofascial techniques at home to maintain   Baseline Target 10-25-16  Pt with no knowledge of self myofascial techniques   Time 4   Period Weeks   Status New  Plan - 2016-10-08 1144    Clinical Impression Statement Mrs. Pilat presents with marked bil tightness of nexk musculatur Left/ right.  Pt with improved posture and increased AROM after last dry needling but presents today with tightening of bil cervical musculature and decreased AROM.  Pt consents to TDN and was closely monitored throughtout session.  Pt with increased left tinnitus post session instead of absence after last weeks dry needlling.  Pt  stayed in the department and was given water for hydration until she felt well enough to drive.  Pt  was told to call when she arrived home and to take it easy the rest of the day.  Pt reported significant improvement after 1st trigger point dry needling and would like to continue.  Pt would benefit from extension of visits for 1-2 x a week for next 4 weeks to recieve 3-4 additional trigger point dry needling appointments and to reinforce HEP for strengtthening of scapular stabilizers Pt would benefit from a 4 week extension to complete goals.   Rehab Potential Good   Clinical Impairments Affecting Rehab Potential multi-factorial symptoms (neck with possible migranous component? due to visual aura changes and decreased VOR)   PT Frequency 1x / week   PT Duration 4 weeks   PT Treatment/Interventions ADLs/Self Care Home Management;Therapeutic exercise;Therapeutic activities;Neuromuscular re-education;Balance training;Patient/family education;Gait training;Dry needling;Manual techniques   PT Next Visit Plan Dry needling, manual therapy,  scapular stabilizers supine   PT Home Exercise Plan ENT provided [email or web address]  *will watch videos to ensure patient performing correctly for trigger point release, neck stretches, Upper trap , levator and neck retraction, lower  trapezius exercises   Consulted and Agree with Plan of Care Patient      Patient will benefit from skilled therapeutic intervention in order to improve the following deficits and impairments:  Decreased range of motion, Abnormal gait, Dizziness, Impaired vision/preception, Decreased balance, Decreased activity tolerance  Visit Diagnosis: Dizziness and giddiness  Cervicalgia  Abnormal posture  Other abnormalities of gait and mobility       G-Codes - 2016-10-08 1154    Functional Assessment Tool Used (Outpatient Only) FOTO   Functional Limitation Mobility: Walking and moving around   Mobility: Walking and Moving Around Current Status [phone]) At least 40 percent but less than 60 percent impaired, limited or restricted  54%   Mobility: Walking and Moving Around Goal Status [phone]) At least 20 percent but less than 40 percent impaired, limited or restricted  42%      Problem List Patient Active Problem List   Diagnosis Date Noted  . Dizziness 05/10/2016   Voncille Lo, PT Certified Exercise Expert for the Aging Adult  10/08/2016 11:59 AM Phone: [phone] Fax: [facility] [address] [place], [place], [address] Phone: [phone]   Fax:  [phone]  Name: Nicole Herrera MRN: [id number] Date of Birth: Jul 16, 1945

## 2016-09-29 ENCOUNTER — Encounter: Payer: Medicare PPO | Admitting: Rehabilitative and Restorative Service Providers"

## 2016-10-04 ENCOUNTER — Ambulatory Visit: Payer: Medicare PPO | Admitting: Physical Therapy

## 2016-10-06 ENCOUNTER — Ambulatory Visit: Payer: Medicare PPO | Admitting: Physical Therapy

## 2016-10-06 ENCOUNTER — Encounter: Payer: Self-pay | Admitting: Physical Therapy

## 2016-10-06 DIAGNOSIS — R2689 Other abnormalities of gait and mobility: Secondary | ICD-10-CM

## 2016-10-06 DIAGNOSIS — R42 Dizziness and giddiness: Secondary | ICD-10-CM

## 2016-10-06 DIAGNOSIS — M542 Cervicalgia: Secondary | ICD-10-CM

## 2016-10-06 DIAGNOSIS — R293 Abnormal posture: Secondary | ICD-10-CM

## 2016-10-06 NOTE — Therapy (Signed)
[place], [place], [address] Phone: [phone]   Fax:  [phone]  Physical Therapy Treatment  Patient Details  Name: Nicole Herrera MRN: [id number] Date of Birth: [birth year] Referring Provider: Silvestre Moment. MD  Encounter Date: 10/06/2016      PT End of Session - 10/06/16 1019    Visit Number 9   Number of Visits 12   Date for PT Re-Evaluation 10/25/16   Authorization Type [employer]   PT Start Time 1018   PT Stop Time 1110   PT Time Calculation (min) 52 min   Activity Tolerance Patient tolerated treatment well   Behavior During Therapy [facility] for tasks assessed/performed      Past Medical History:  Diagnosis Date  . Acid reflux   . Barrett's esophagus   . Hyperlipemia   . Hypertension   . Migraine   . Neck pain   . Sinusitis   . Tinnitus   . Trigger finger   . Vertigo     Past Surgical History:  Procedure Laterality Date  . ABDOMINAL HYSTERECTOMY    . NASAL SINUS SURGERY    . NECK SURGERY    . TRIGGER FINGER RELEASE      There were no vitals filed for this visit.      Subjective Assessment - 10/06/16 1021    Subjective "my neck is feeling better, but I have been feeling soreness in the middle and back is acting up. The DN helped the pressure    Currently in Pain? Yes   Pain Score 7    Pain Orientation Right;Left   Pain Descriptors / Indicators Tightness   Pain Type Chronic pain   Pain Onset More than a month ago   Pain Frequency Intermittent   Aggravating Factors  prolonged sitting,    Pain Relieving Factors gentle movement, rest                         [facility] Adult PT Treatment/Exercise - 10/06/16 1112      Neck Exercises: Seated   Neck Retraction 10 reps;5 secs   Other Seated Exercise thoracic extension over back of chair 2 x 10 (muliple verbal cues to keep chin tucked)     Moist Heat Therapy   Number Minutes Moist Heat 10 Minutes   Moist Heat Location Cervical  pt in sitting to acclimate pt to sitting up and reduce dizzi     Manual Therapy   Manual Therapy Taping   Manual therapy comments suboccipital release   Joint Mobilization thoracic PA T1-T7 grade 3   Soft tissue mobilization bil upper trap, levator, scalenes, SCM Left > Right   Mulligan R upper trap inhibition taping     Neck Exercises: Stretches   Upper Trapezius Stretch 3 reps;30 seconds          Trigger Point Dry Needling - 10/06/16 1030    Consent Given? Yes   Education Handout Provided Yes  given prevously   Upper Trapezius Response Twitch reponse elicited;Palpable increased muscle length   Oblique Capitus Response Twitch response elicited;Palpable increased muscle length              PT Education - 10/06/16 1114    Education provided Yes   Education Details benefits of improving thoracic mobility, and updated HEP for extension over chair.    Person(s) Educated Patient   Methods Explanation;Verbal cues   Comprehension Verbalized understanding;Verbal cues required  PT Long Term Goals - 09/27/16 0915      PT LONG TERM GOAL #1   Title The patient will return demonstrate gaze x 1 adaptation x 30 seconds nonstop without sensation of dizziness ("waviness"). Target date for all LTGs is 10/13/16 (see note in End of Session)   Time 4   Period Weeks   Status Partially Met     PT LONG TERM GOAL #2   Title The patient will return demonstrate cervical stretching/flexibility exercises for HEP for muscle length.   Time 4   Period Weeks   Status Achieved     PT LONG TERM GOAL #3   Title The patient will tolerate seated head motion x 5 reps without reports of dizziness or 'waviness'.   Time 4   Period Weeks   Status Achieved     PT LONG TERM GOAL #4   Title The patient will be further assessed for kinesthetic cervical sense.   Time 4   Period Weeks   Status Achieved     PT LONG TERM GOAL #5   Title The patient will  report neck pain at worst is < or equal to 6/10 (currently 9/10 after typing).   Baseline currently 5-6/10 today and causes distraction to daily activities Target 10-25-16   Time 4   Period Weeks   Status On-going     PT LONG TERM GOAL #6   Title The patient will return demo proper positioning at computer to reduce postural strain/pain.   Baseline reinforcing posture and scapular stabilization Target 10-25-16   Time 4   Period Weeks   Status On-going     PT LONG TERM GOAL #7   Title Pt will recieve at least 5-6 maximal TDN treatment for palpable lengthening of cervical musculature and utilize stretches and self myofascial techniques at home to maintain   Baseline Target 10-25-16  Pt with no knowledge of self myofascial techniques   Time 4   Period Weeks   Status New               Plan - 10/06/16 1115    Clinical Impression Statement [name] report continued pain today that had calmed down since the last session but took a few days. continued TPDN over the bil upper trap and cervical obliquous capitis. followed TPDN with IATM and thoracic mobilization techniques which she rpeorted reduce tightness, trialed inhibition taping to reduce tightness in the R upper trap. utilized MHP with pt in sitting to acclimate her to sitting versus laying to reduce potential dizziness post session.    PT Next Visit Plan Dry needling, manual therapy,  scapular stabilizers supine, thoracic mobility, how was taping, FOTO G-code?   PT Home Exercise Plan ENT provided [email or web address]  *will watch videos to ensure patient performing correctly for trigger point release, neck stretches, Upper trap , levator and neck retraction, lower trapezius exercises   Consulted and Agree with Plan of Care Patient      Patient will benefit from skilled therapeutic intervention in order to improve the following deficits and impairments:  Decreased range of motion, Abnormal gait, Dizziness, Impaired  vision/preception, Decreased balance, Decreased activity tolerance  Visit Diagnosis: Dizziness and giddiness  Cervicalgia  Abnormal posture  Other abnormalities of gait and mobility     Problem List Patient Active Problem List   Diagnosis Date Noted  . Dizziness 05/10/2016   Starr Lake PT, DPT, LAT, ATC  10/06/16  11:24 AM      [place]  [facility] [address] [place], [place], [address] Phone: [phone]   Fax:  [phone]  Name: Nicole Herrera MRN: [id number] Date of Birth: 1946/03/02

## 2016-10-11 ENCOUNTER — Ambulatory Visit: Payer: Medicare PPO | Admitting: Physical Therapy

## 2016-10-11 DIAGNOSIS — R42 Dizziness and giddiness: Secondary | ICD-10-CM | POA: Diagnosis not present

## 2016-10-14 ENCOUNTER — Encounter: Payer: Medicare PPO | Admitting: Physical Therapy

## 2016-10-18 ENCOUNTER — Encounter: Payer: Medicare PPO | Admitting: Physical Therapy

## 2016-10-24 ENCOUNTER — Encounter: Payer: Self-pay | Admitting: Physical Therapy

## 2016-10-24 ENCOUNTER — Ambulatory Visit: Payer: Medicare PPO | Attending: Family Medicine | Admitting: Physical Therapy

## 2016-10-24 DIAGNOSIS — M542 Cervicalgia: Secondary | ICD-10-CM | POA: Diagnosis not present

## 2016-10-24 DIAGNOSIS — R2689 Other abnormalities of gait and mobility: Secondary | ICD-10-CM

## 2016-10-24 DIAGNOSIS — R42 Dizziness and giddiness: Secondary | ICD-10-CM

## 2016-10-24 DIAGNOSIS — R293 Abnormal posture: Secondary | ICD-10-CM | POA: Diagnosis not present

## 2016-10-24 NOTE — Therapy (Signed)
[place], [place], [address] Phone: [phone]   Fax:  [phone]  Physical Therapy Treatment/ Discharge summary  Patient Details  Name: Nicole Herrera MRN: [id number] Date of Birth: January 22, 1946 Referring Provider: Silvestre Moment. MD  Encounter Date: 10/24/2016      PT End of Session - 10/24/16 1418    Visit Number 10   Number of Visits 12   Date for PT Re-Evaluation 10/25/16   PT Start Time 2458   PT Stop Time 1450   PT Time Calculation (min) 34 min   Activity Tolerance Patient tolerated treatment well   Behavior During Therapy [facility] for tasks assessed/performed      Past Medical History:  Diagnosis Date  . Acid reflux   . Barrett's esophagus   . Hyperlipemia   . Hypertension   . Migraine   . Neck pain   . Sinusitis   . Tinnitus   . Trigger finger   . Vertigo     Past Surgical History:  Procedure Laterality Date  . ABDOMINAL HYSTERECTOMY    . NASAL SINUS SURGERY    . NECK SURGERY    . TRIGGER FINGER RELEASE      There were no vitals filed for this visit.      Subjective Assessment - 10/24/16 1419    Subjective "I feel like I am able to move better and am getting popping in the neck and upper back. Continued dizziness which seems to fluctuate in intensity which the last episode being 4 days"  no pain just lightheaded and dizziness   Currently in Pain? Yes   Pain Score 0-No pain   Pain Orientation Right;Left   Pain Descriptors / Indicators --  dizziness   Pain Type Chronic pain   Pain Onset More than a month ago   Pain Frequency Occasional   Aggravating Factors  no pain, sudden movements causes dizziness, bending forward    Pain Relieving Factors resting,             [facility] PT Assessment - 10/24/16 1425      Observation/Other Assessments   Focus on Therapeutic Outcomes (FOTO)  49% limited     AROM   Cervical Flexion 60   Cervical Extension 40   Cervical -  Right Side Bend 40   Cervical - Left Side Bend 32   Cervical - Right Rotation 60   Cervical - Left Rotation 62                     [facility] Adult PT Treatment/Exercise - 10/24/16 1444      Manual Therapy   Manual therapy comments manual trigger point release bil upper trap, rhomboids  how to perofrm at home.      Neck Exercises: Stretches   Upper Trapezius Stretch 2 reps;30 seconds   Levator Stretch 2 reps;30 seconds   Neck Stretch 2 reps;30 seconds                PT Education - 10/24/16 1439    Education provided Yes   Education Details reviewed previously provided HEP. manual trigger point release techniques. benefits and how to perform at home    Person(s) Educated Patient   Methods Explanation;Verbal cues;Handout   Comprehension Verbalized understanding;Verbal cues required             PT Long Term Goals - 10/24/16 1436      PT LONG TERM GOAL #1  Title The patient will return demonstrate gaze x 1 adaptation x 30 seconds nonstop without sensation of dizziness ("waviness"). Target date for all LTGs is 10/13/16 (see note in End of Session)   Period Weeks   Status Partially Met     PT LONG TERM GOAL #2   Title The patient will return demonstrate cervical stretching/flexibility exercises for HEP for muscle length.   Time 4   Period Weeks   Status Achieved     PT LONG TERM GOAL #3   Title The patient will tolerate seated head motion x 5 reps without reports of dizziness or 'waviness'.   Time 4   Period Weeks   Status Achieved     PT LONG TERM GOAL #4   Title The patient will be further assessed for kinesthetic cervical sense.   Time 4   Period Weeks   Status Achieved     PT LONG TERM GOAL #5   Title The patient will report neck pain at worst is < or equal to 6/10 (currently 9/10 after typing).   Baseline 3-4/10    Time 4   Period Weeks   Status Achieved     PT LONG TERM GOAL #6   Title The patient will return demo proper positioning at  computer to reduce postural strain/pain.   Time 4   Period Weeks   Status Achieved     PT LONG TERM GOAL #7   Title Pt will recieve at least 5-6 maximal TDN treatment for palpable lengthening of cervical musculature and utilize stretches and self myofascial techniques at home to maintain   Time 4   Period Weeks   Status Achieved               Plan - 10/24/16 1449    Clinical Impression Statement Mrs. Castelluccio reporting improvement in neck mobiliy and minimal soreness in the neck. pt states she still has dizziness which doesn't seem to improve.  reviewed her HEP / stretching and performed manual trigger point release and educated how it can be performed at home. she has met or partially met all goals. pt is able to maintain and progress her current level of function and will be discharged from PT .    PT Treatment/Interventions ADLs/Self Care Home Management;Therapeutic exercise;Therapeutic activities;Neuromuscular re-education;Balance training;Patient/family education;Gait training;Dry needling;Manual techniques   PT Next Visit Plan D/C   PT Home Exercise Plan ENT provided [email or web address]  *will watch videos to ensure patient performing correctly for trigger point release, neck stretches, Upper trap , levator and neck retraction, lower trapezius exercises, manual trigger point release techniques   Consulted and Agree with Plan of Care Patient      Patient will benefit from skilled therapeutic intervention in order to improve the following deficits and impairments:  Decreased range of motion, Abnormal gait, Dizziness, Impaired vision/preception, Decreased balance, Decreased activity tolerance  Visit Diagnosis: Dizziness and giddiness  Abnormal posture  Other abnormalities of gait and mobility  Cervicalgia     Problem List Patient Active Problem List   Diagnosis Date Noted  . Dizziness 05/10/2016   Starr Lake PT, DPT, LAT, ATC  10/24/16  2:57  PM      [place] [facility] [address] [place], [place], [address] Phone: [phone]   Fax:  [phone]  Name: MYCHELLE KENDRA MRN: [id number] Date of Birth: November 16, 1945       PHYSICAL THERAPY DISCHARGE SUMMARY  Visits from Start of Care: 10  Current functional level  related to goals / functional outcomes: See goals, FOTO 49% limited   Remaining deficits: Intermittent tightness in the L/  R upper trap/ levator scapulae. Continued intermittent dizziness and feeling in a fog with off and on HA.    Education / Equipment: HEP, theraband, manual trigger point release, posture/ biomechanics  Plan: Patient agrees to discharge.  Patient goals were partially met. Patient is being discharged due to being pleased with the current functional level.  ?????    Jojuan Champney PT, DPT, LAT, ATC  10/24/16  2:58 PM

## 2016-11-09 DIAGNOSIS — H903 Sensorineural hearing loss, bilateral: Secondary | ICD-10-CM | POA: Diagnosis not present

## 2016-11-24 DIAGNOSIS — H903 Sensorineural hearing loss, bilateral: Secondary | ICD-10-CM | POA: Diagnosis not present

## 2016-11-24 DIAGNOSIS — H6982 Other specified disorders of Eustachian tube, left ear: Secondary | ICD-10-CM | POA: Diagnosis not present

## 2016-12-09 DIAGNOSIS — J309 Allergic rhinitis, unspecified: Secondary | ICD-10-CM | POA: Diagnosis not present

## 2016-12-09 DIAGNOSIS — K2270 Barrett's esophagus without dysplasia: Secondary | ICD-10-CM | POA: Diagnosis not present

## 2016-12-09 DIAGNOSIS — M65312 Trigger thumb, left thumb: Secondary | ICD-10-CM | POA: Diagnosis not present

## 2016-12-09 DIAGNOSIS — R42 Dizziness and giddiness: Secondary | ICD-10-CM | POA: Diagnosis not present

## 2016-12-09 DIAGNOSIS — Z1389 Encounter for screening for other disorder: Secondary | ICD-10-CM | POA: Diagnosis not present

## 2016-12-09 DIAGNOSIS — Z0000 Encounter for general adult medical examination without abnormal findings: Secondary | ICD-10-CM | POA: Diagnosis not present

## 2016-12-09 DIAGNOSIS — I10 Essential (primary) hypertension: Secondary | ICD-10-CM | POA: Diagnosis not present

## 2016-12-09 DIAGNOSIS — N951 Menopausal and female climacteric states: Secondary | ICD-10-CM | POA: Diagnosis not present

## 2016-12-09 IMAGING — MR MR HEAD W/O CM
10 series · 48 of 48 positions shown · non-contrast
Comparison: None.

CLINICAL DATA: Dizziness

EXAM:
MRI HEAD WITHOUT CONTRAST
TECHNIQUE: Multiplanar, multiecho pulse sequences of the brain and surrounding
structures were obtained without intravenous contrast.

[Series 5: T1 · sagittal · 4.0mm · 0.75mm/px · 4 of 31 slices shown (1 of 2)]
[im 1/31]
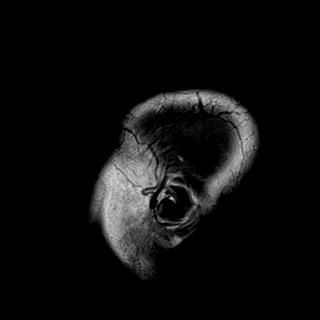
[im 11/31]
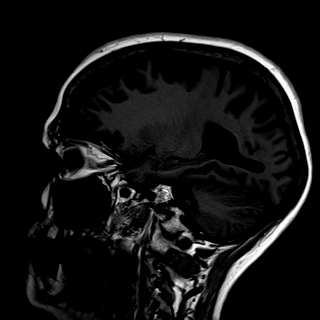
[im 21/31]
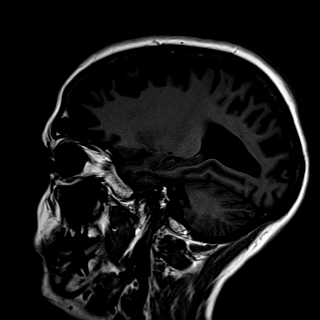
[im 31/31]
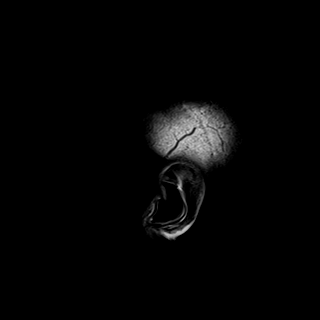

[Series 6: DWI · axial · 3.0mm · 1.50mm/px · z∈[-89,+50]mm · 7 of 88 slices shown (1 of 4)]
[im 1/88]
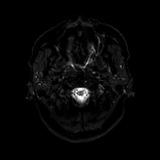
[im 15/88]
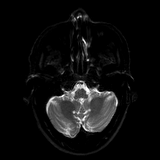
[im 30/88]
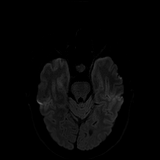
[im 44/88]
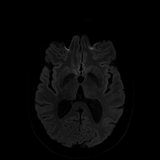
[im 59/88]
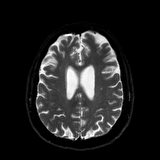
[im 73/88]
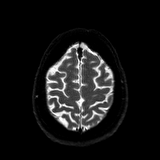
[im 88/88]
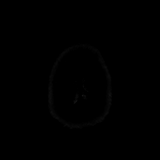

[Series 7: DWI · axial · 3.0mm · 1.50mm/px · z∈[-89,+50]mm · 4 of 44 slices shown (2 of 4)]
[im 1/44]
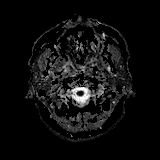
[im 15/44]
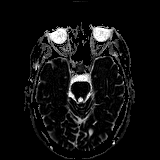
[im 29/44]
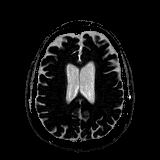
[im 44/44]
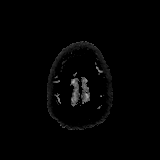

[Series 8: DWI · coronal · 5.0mm · 1.50mm/px · 5 of 60 slices shown (3 of 4)]
[im 1/60]
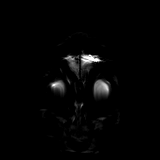
[im 15/60]
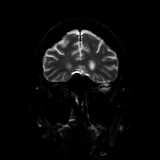
[im 30/60]
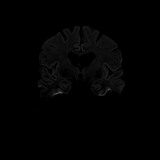
[im 45/60]
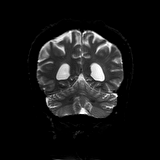
[im 60/60]
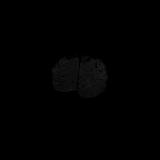

[Series 9: DWI · coronal · 5.0mm · 1.50mm/px · 2 of 30 slices shown (4 of 4)]
[im 1/30]
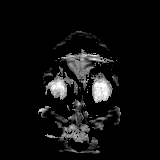
[im 30/30]
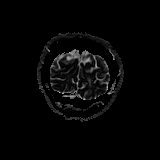

[Series 10: T2 · axial · 4.0mm · 0.38mm/px · z∈[-87,+51]mm · 2 of 28 slices shown (1 of 2)]
[im 1/28]
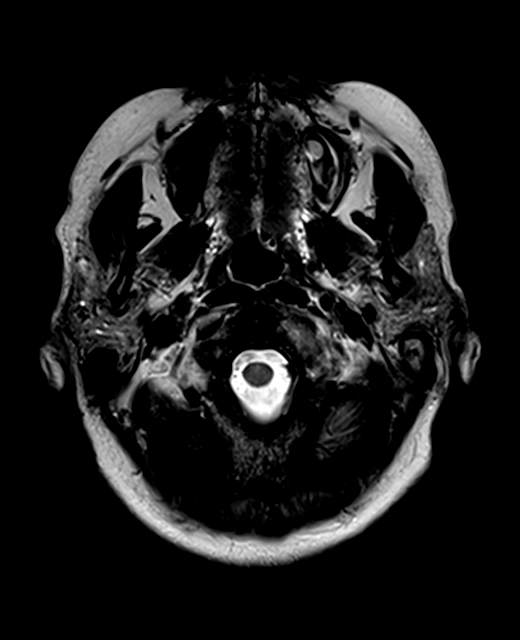
[im 28/28]
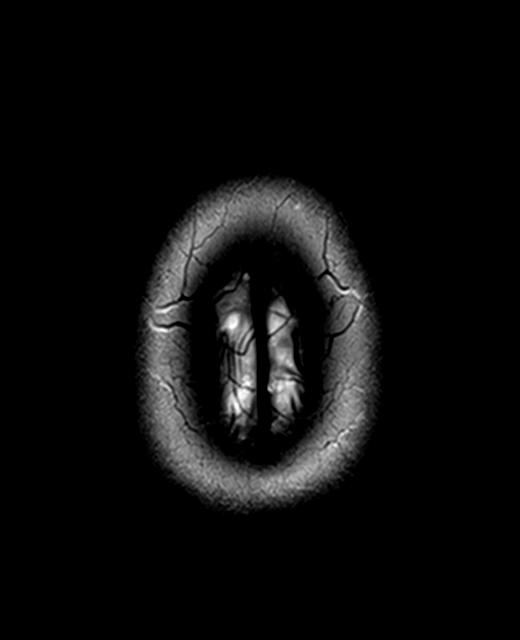

[Series 11: FLAIR · axial · 4.0mm · 0.75mm/px · z∈[-89,+49]mm · 2 of 28 slices shown]
[im 1/28]
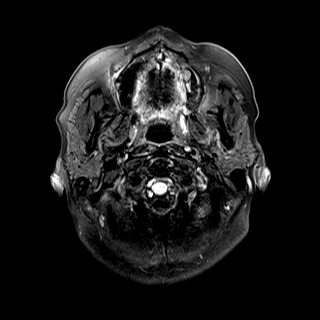
[im 28/28]
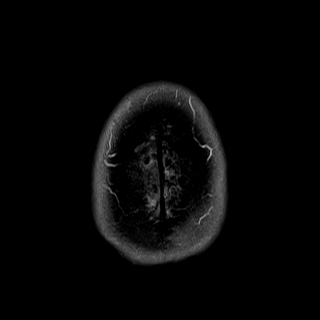

[Series 13: swi_images · axial · 1.5mm · 0.90mm/px · z∈[-87,+54]mm · 8 of 96 slices shown]
[im 1/96]
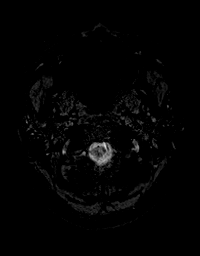
[im 14/96]
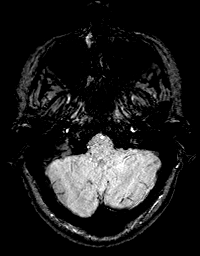
[im 28/96]
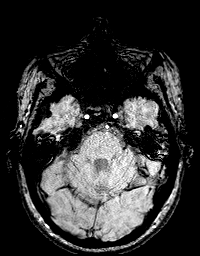
[im 41/96]
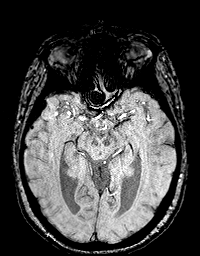
[im 55/96]
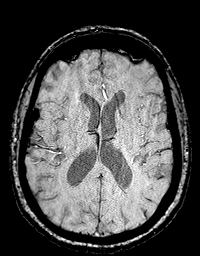
[im 68/96]
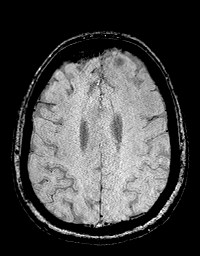
[im 82/96]
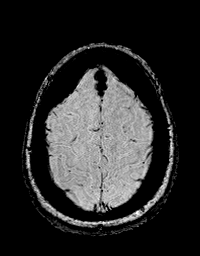
[im 96/96]
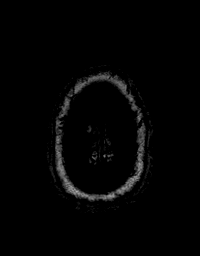

[Series 14: T1 · axial · 1.0mm · 0.94mm/px · z∈[-88,+53]mm · 12 of 144 slices shown (2 of 2)]
[im 1/144]
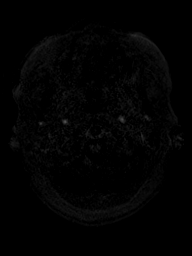
[im 14/144]
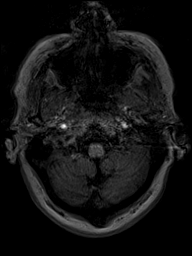
[im 27/144]
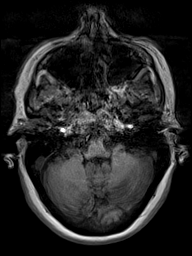
[im 40/144]
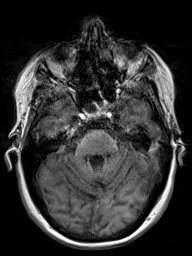
[im 53/144]
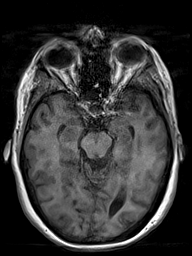
[im 66/144]
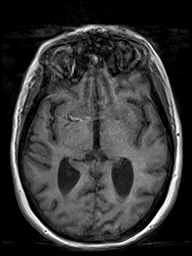
[im 79/144]
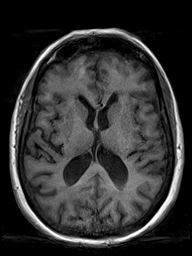
[im 92/144]
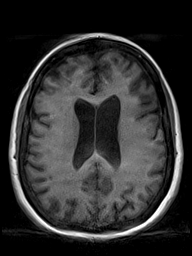
[im 105/144]
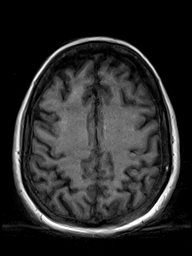
[im 118/144]
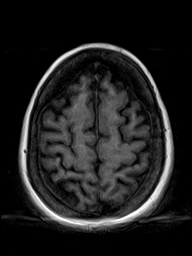
[im 131/144]
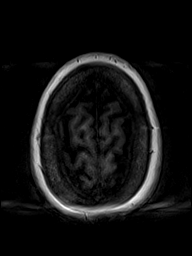
[im 144/144]
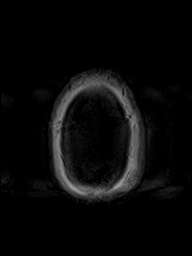

[Series 15: T2 · coronal · 4.5mm · 0.36mm/px · 2 of 30 slices shown (2 of 2)]
[im 1/30]
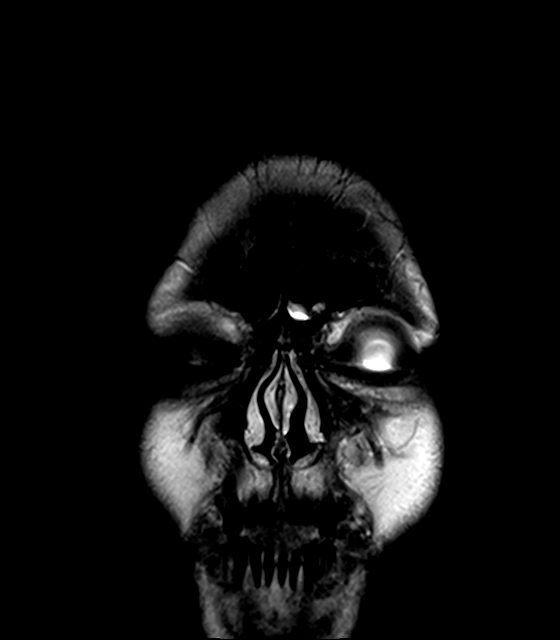
[im 30/30]
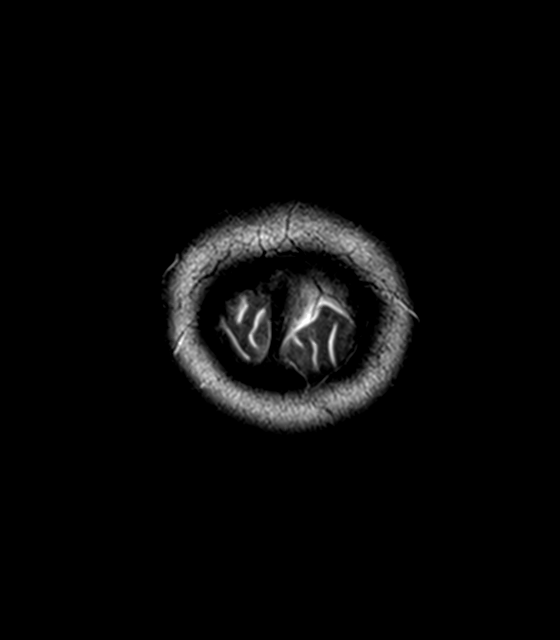

[48 of 48 positions shown; findings below may reference images not displayed]

FINDINGS: Brain: Ventricle size and cerebral volume normal for age. Negative
for acute infarct. Several small white matter hyperintensities
bilaterally. Basal ganglia and brainstem and cerebellum normal.
Negative for hemorrhage or mass.

Vascular: Normal arterial flow voids

Skull and upper cervical spine: Negative

Sinuses/Orbits: Prior sinus surgery. Mucosal edema in the paranasal
sinuses. Normal orbit.

Other: None
IMPRESSION: No acute abnormality.  Normal for age  MRI of the brain

Mucosal edema in the paranasal sinuses with evidence of prior sinus
surgery.

## 2016-12-15 DIAGNOSIS — M542 Cervicalgia: Secondary | ICD-10-CM | POA: Diagnosis not present

## 2016-12-15 DIAGNOSIS — H6982 Other specified disorders of Eustachian tube, left ear: Secondary | ICD-10-CM | POA: Diagnosis not present

## 2016-12-15 DIAGNOSIS — R42 Dizziness and giddiness: Secondary | ICD-10-CM | POA: Diagnosis not present

## 2017-01-16 DIAGNOSIS — H6982 Other specified disorders of Eustachian tube, left ear: Secondary | ICD-10-CM | POA: Diagnosis not present

## 2017-01-16 DIAGNOSIS — R42 Dizziness and giddiness: Secondary | ICD-10-CM | POA: Diagnosis not present

## 2017-04-08 DIAGNOSIS — S9031XA Contusion of right foot, initial encounter: Secondary | ICD-10-CM | POA: Diagnosis not present

## 2017-04-18 DIAGNOSIS — S9031XA Contusion of right foot, initial encounter: Secondary | ICD-10-CM | POA: Diagnosis not present

## 2017-04-24 DIAGNOSIS — K219 Gastro-esophageal reflux disease without esophagitis: Secondary | ICD-10-CM | POA: Diagnosis not present

## 2017-04-24 DIAGNOSIS — Z6834 Body mass index (BMI) 34.0-34.9, adult: Secondary | ICD-10-CM | POA: Diagnosis not present

## 2017-04-24 DIAGNOSIS — E669 Obesity, unspecified: Secondary | ICD-10-CM | POA: Diagnosis not present

## 2017-04-24 DIAGNOSIS — J310 Chronic rhinitis: Secondary | ICD-10-CM | POA: Diagnosis not present

## 2017-04-24 DIAGNOSIS — N951 Menopausal and female climacteric states: Secondary | ICD-10-CM | POA: Diagnosis not present

## 2017-05-12 DIAGNOSIS — M542 Cervicalgia: Secondary | ICD-10-CM | POA: Diagnosis not present

## 2017-06-06 DIAGNOSIS — M542 Cervicalgia: Secondary | ICD-10-CM | POA: Diagnosis not present

## 2017-06-08 ENCOUNTER — Other Ambulatory Visit: Payer: Self-pay | Admitting: Family Medicine

## 2017-06-08 DIAGNOSIS — Z1239 Encounter for other screening for malignant neoplasm of breast: Secondary | ICD-10-CM

## 2017-06-15 DIAGNOSIS — M542 Cervicalgia: Secondary | ICD-10-CM | POA: Diagnosis not present

## 2017-06-19 DIAGNOSIS — M542 Cervicalgia: Secondary | ICD-10-CM | POA: Diagnosis not present

## 2017-06-21 DIAGNOSIS — R42 Dizziness and giddiness: Secondary | ICD-10-CM | POA: Diagnosis not present

## 2017-06-21 DIAGNOSIS — R609 Edema, unspecified: Secondary | ICD-10-CM | POA: Diagnosis not present

## 2017-06-21 DIAGNOSIS — Z6836 Body mass index (BMI) 36.0-36.9, adult: Secondary | ICD-10-CM | POA: Diagnosis not present

## 2017-06-21 DIAGNOSIS — I10 Essential (primary) hypertension: Secondary | ICD-10-CM | POA: Diagnosis not present

## 2017-06-21 DIAGNOSIS — E785 Hyperlipidemia, unspecified: Secondary | ICD-10-CM | POA: Diagnosis not present

## 2017-06-21 DIAGNOSIS — M5030 Other cervical disc degeneration, unspecified cervical region: Secondary | ICD-10-CM | POA: Diagnosis not present

## 2017-06-27 DIAGNOSIS — E871 Hypo-osmolality and hyponatremia: Secondary | ICD-10-CM | POA: Diagnosis not present

## 2017-06-28 ENCOUNTER — Ambulatory Visit (INDEPENDENT_AMBULATORY_CARE_PROVIDER_SITE_OTHER): Payer: Medicare PPO

## 2017-06-28 DIAGNOSIS — Z1231 Encounter for screening mammogram for malignant neoplasm of breast: Secondary | ICD-10-CM | POA: Diagnosis not present

## 2017-06-28 DIAGNOSIS — Z6834 Body mass index (BMI) 34.0-34.9, adult: Secondary | ICD-10-CM | POA: Diagnosis not present

## 2017-06-28 DIAGNOSIS — M47812 Spondylosis without myelopathy or radiculopathy, cervical region: Secondary | ICD-10-CM | POA: Diagnosis not present

## 2017-06-28 DIAGNOSIS — Z1239 Encounter for other screening for malignant neoplasm of breast: Secondary | ICD-10-CM

## 2017-06-29 ENCOUNTER — Ambulatory Visit: Payer: Medicare PPO

## 2017-06-30 ENCOUNTER — Ambulatory Visit: Payer: Medicare PPO

## 2017-07-06 DIAGNOSIS — M9981 Other biomechanical lesions of cervical region: Secondary | ICD-10-CM | POA: Diagnosis not present

## 2017-07-06 DIAGNOSIS — I10 Essential (primary) hypertension: Secondary | ICD-10-CM | POA: Diagnosis not present

## 2017-07-06 DIAGNOSIS — M47812 Spondylosis without myelopathy or radiculopathy, cervical region: Secondary | ICD-10-CM | POA: Diagnosis not present

## 2017-07-12 DIAGNOSIS — M47812 Spondylosis without myelopathy or radiculopathy, cervical region: Secondary | ICD-10-CM | POA: Diagnosis not present

## 2017-07-14 DIAGNOSIS — E871 Hypo-osmolality and hyponatremia: Secondary | ICD-10-CM | POA: Diagnosis not present

## 2017-07-25 DIAGNOSIS — M47812 Spondylosis without myelopathy or radiculopathy, cervical region: Secondary | ICD-10-CM | POA: Diagnosis not present

## 2017-07-25 DIAGNOSIS — I10 Essential (primary) hypertension: Secondary | ICD-10-CM | POA: Diagnosis not present

## 2017-07-25 DIAGNOSIS — Z6834 Body mass index (BMI) 34.0-34.9, adult: Secondary | ICD-10-CM | POA: Diagnosis not present

## 2017-07-26 DIAGNOSIS — Z6834 Body mass index (BMI) 34.0-34.9, adult: Secondary | ICD-10-CM | POA: Diagnosis not present

## 2017-07-26 DIAGNOSIS — M47812 Spondylosis without myelopathy or radiculopathy, cervical region: Secondary | ICD-10-CM | POA: Diagnosis not present

## 2017-07-26 DIAGNOSIS — I10 Essential (primary) hypertension: Secondary | ICD-10-CM | POA: Diagnosis not present

## 2017-08-09 DIAGNOSIS — M47812 Spondylosis without myelopathy or radiculopathy, cervical region: Secondary | ICD-10-CM | POA: Diagnosis not present

## 2017-08-09 DIAGNOSIS — I10 Essential (primary) hypertension: Secondary | ICD-10-CM | POA: Diagnosis not present

## 2017-08-09 DIAGNOSIS — Z6834 Body mass index (BMI) 34.0-34.9, adult: Secondary | ICD-10-CM | POA: Diagnosis not present

## 2017-08-14 DIAGNOSIS — M545 Low back pain: Secondary | ICD-10-CM | POA: Diagnosis not present

## 2017-08-17 DIAGNOSIS — M9981 Other biomechanical lesions of cervical region: Secondary | ICD-10-CM | POA: Diagnosis not present

## 2017-08-26 DIAGNOSIS — M545 Low back pain: Secondary | ICD-10-CM | POA: Diagnosis not present

## 2017-09-04 DIAGNOSIS — S60221A Contusion of right hand, initial encounter: Secondary | ICD-10-CM | POA: Diagnosis not present

## 2017-09-12 DIAGNOSIS — S60221A Contusion of right hand, initial encounter: Secondary | ICD-10-CM | POA: Diagnosis not present

## 2017-09-12 DIAGNOSIS — M79641 Pain in right hand: Secondary | ICD-10-CM | POA: Diagnosis not present

## 2017-09-13 DIAGNOSIS — Z6831 Body mass index (BMI) 31.0-31.9, adult: Secondary | ICD-10-CM | POA: Diagnosis not present

## 2017-09-13 DIAGNOSIS — M47812 Spondylosis without myelopathy or radiculopathy, cervical region: Secondary | ICD-10-CM | POA: Diagnosis not present

## 2017-09-13 DIAGNOSIS — I10 Essential (primary) hypertension: Secondary | ICD-10-CM | POA: Diagnosis not present

## 2017-09-28 DIAGNOSIS — M47812 Spondylosis without myelopathy or radiculopathy, cervical region: Secondary | ICD-10-CM | POA: Diagnosis not present

## 2017-12-04 DIAGNOSIS — R600 Localized edema: Secondary | ICD-10-CM | POA: Diagnosis not present

## 2017-12-04 DIAGNOSIS — I8312 Varicose veins of left lower extremity with inflammation: Secondary | ICD-10-CM | POA: Diagnosis not present

## 2017-12-04 DIAGNOSIS — I83893 Varicose veins of bilateral lower extremities with other complications: Secondary | ICD-10-CM | POA: Diagnosis not present

## 2017-12-04 DIAGNOSIS — I8311 Varicose veins of right lower extremity with inflammation: Secondary | ICD-10-CM | POA: Diagnosis not present

## 2017-12-13 DIAGNOSIS — I10 Essential (primary) hypertension: Secondary | ICD-10-CM | POA: Diagnosis not present

## 2017-12-13 DIAGNOSIS — M722 Plantar fascial fibromatosis: Secondary | ICD-10-CM | POA: Diagnosis not present

## 2017-12-13 DIAGNOSIS — R600 Localized edema: Secondary | ICD-10-CM | POA: Diagnosis not present

## 2017-12-13 DIAGNOSIS — R200 Anesthesia of skin: Secondary | ICD-10-CM | POA: Diagnosis not present

## 2017-12-13 DIAGNOSIS — K219 Gastro-esophageal reflux disease without esophagitis: Secondary | ICD-10-CM | POA: Diagnosis not present

## 2017-12-19 DIAGNOSIS — E871 Hypo-osmolality and hyponatremia: Secondary | ICD-10-CM | POA: Diagnosis not present

## 2018-01-04 DIAGNOSIS — M47812 Spondylosis without myelopathy or radiculopathy, cervical region: Secondary | ICD-10-CM | POA: Diagnosis not present

## 2018-01-16 DIAGNOSIS — E871 Hypo-osmolality and hyponatremia: Secondary | ICD-10-CM | POA: Diagnosis not present

## 2018-01-16 DIAGNOSIS — N951 Menopausal and female climacteric states: Secondary | ICD-10-CM | POA: Diagnosis not present

## 2018-01-16 DIAGNOSIS — I10 Essential (primary) hypertension: Secondary | ICD-10-CM | POA: Diagnosis not present

## 2018-01-16 DIAGNOSIS — R600 Localized edema: Secondary | ICD-10-CM | POA: Diagnosis not present

## 2018-01-16 DIAGNOSIS — L989 Disorder of the skin and subcutaneous tissue, unspecified: Secondary | ICD-10-CM | POA: Diagnosis not present

## 2018-01-24 DIAGNOSIS — S91119A Laceration without foreign body of unspecified toe without damage to nail, initial encounter: Secondary | ICD-10-CM | POA: Diagnosis not present

## 2018-01-29 DIAGNOSIS — I8311 Varicose veins of right lower extremity with inflammation: Secondary | ICD-10-CM | POA: Diagnosis not present

## 2018-01-29 DIAGNOSIS — I8312 Varicose veins of left lower extremity with inflammation: Secondary | ICD-10-CM | POA: Diagnosis not present

## 2018-02-01 DIAGNOSIS — I10 Essential (primary) hypertension: Secondary | ICD-10-CM | POA: Diagnosis not present

## 2018-02-08 DIAGNOSIS — X32XXXA Exposure to sunlight, initial encounter: Secondary | ICD-10-CM | POA: Diagnosis not present

## 2018-02-08 DIAGNOSIS — L570 Actinic keratosis: Secondary | ICD-10-CM | POA: Diagnosis not present

## 2018-02-13 DIAGNOSIS — I8311 Varicose veins of right lower extremity with inflammation: Secondary | ICD-10-CM | POA: Diagnosis not present

## 2018-02-13 DIAGNOSIS — I8312 Varicose veins of left lower extremity with inflammation: Secondary | ICD-10-CM | POA: Diagnosis not present

## 2018-02-13 DIAGNOSIS — I83893 Varicose veins of bilateral lower extremities with other complications: Secondary | ICD-10-CM | POA: Diagnosis not present

## 2019-12-05 DIAGNOSIS — M5416 Radiculopathy, lumbar region: Secondary | ICD-10-CM | POA: Diagnosis not present

## 2019-12-11 DIAGNOSIS — Z6836 Body mass index (BMI) 36.0-36.9, adult: Secondary | ICD-10-CM | POA: Diagnosis not present

## 2019-12-11 DIAGNOSIS — M5416 Radiculopathy, lumbar region: Secondary | ICD-10-CM | POA: Diagnosis not present

## 2019-12-11 DIAGNOSIS — M545 Low back pain: Secondary | ICD-10-CM | POA: Diagnosis not present

## 2019-12-11 DIAGNOSIS — I10 Essential (primary) hypertension: Secondary | ICD-10-CM | POA: Diagnosis not present

## 2019-12-16 DIAGNOSIS — I10 Essential (primary) hypertension: Secondary | ICD-10-CM | POA: Diagnosis not present

## 2019-12-16 DIAGNOSIS — E785 Hyperlipidemia, unspecified: Secondary | ICD-10-CM | POA: Diagnosis not present

## 2019-12-16 DIAGNOSIS — N951 Menopausal and female climacteric states: Secondary | ICD-10-CM | POA: Diagnosis not present

## 2019-12-16 DIAGNOSIS — Z79899 Other long term (current) drug therapy: Secondary | ICD-10-CM | POA: Diagnosis not present

## 2019-12-16 DIAGNOSIS — K219 Gastro-esophageal reflux disease without esophagitis: Secondary | ICD-10-CM | POA: Diagnosis not present

## 2020-01-02 DIAGNOSIS — M545 Low back pain: Secondary | ICD-10-CM | POA: Diagnosis not present

## 2020-01-02 DIAGNOSIS — M5416 Radiculopathy, lumbar region: Secondary | ICD-10-CM | POA: Diagnosis not present

## 2020-01-02 DIAGNOSIS — M6281 Muscle weakness (generalized): Secondary | ICD-10-CM | POA: Diagnosis not present

## 2020-01-02 DIAGNOSIS — R262 Difficulty in walking, not elsewhere classified: Secondary | ICD-10-CM | POA: Diagnosis not present

## 2020-02-13 DIAGNOSIS — M545 Low back pain: Secondary | ICD-10-CM | POA: Diagnosis not present

## 2020-02-13 DIAGNOSIS — M4716 Other spondylosis with myelopathy, lumbar region: Secondary | ICD-10-CM | POA: Diagnosis not present

## 2020-02-13 DIAGNOSIS — M5106 Intervertebral disc disorders with myelopathy, lumbar region: Secondary | ICD-10-CM | POA: Diagnosis not present

## 2020-02-13 DIAGNOSIS — M48062 Spinal stenosis, lumbar region with neurogenic claudication: Secondary | ICD-10-CM | POA: Diagnosis not present

## 2020-02-19 DIAGNOSIS — M5136 Other intervertebral disc degeneration, lumbar region: Secondary | ICD-10-CM | POA: Diagnosis not present

## 2020-02-19 DIAGNOSIS — Z01818 Encounter for other preprocedural examination: Secondary | ICD-10-CM | POA: Diagnosis not present

## 2020-02-19 DIAGNOSIS — I10 Essential (primary) hypertension: Secondary | ICD-10-CM | POA: Diagnosis not present

## 2020-02-19 DIAGNOSIS — M545 Low back pain: Secondary | ICD-10-CM | POA: Diagnosis not present

## 2020-02-27 DIAGNOSIS — I10 Essential (primary) hypertension: Secondary | ICD-10-CM | POA: Diagnosis not present

## 2020-02-27 DIAGNOSIS — E785 Hyperlipidemia, unspecified: Secondary | ICD-10-CM | POA: Diagnosis not present

## 2020-04-10 DIAGNOSIS — M4716 Other spondylosis with myelopathy, lumbar region: Secondary | ICD-10-CM | POA: Diagnosis not present

## 2020-04-10 DIAGNOSIS — M48062 Spinal stenosis, lumbar region with neurogenic claudication: Secondary | ICD-10-CM | POA: Diagnosis not present

## 2020-04-10 DIAGNOSIS — Z981 Arthrodesis status: Secondary | ICD-10-CM | POA: Diagnosis not present

## 2020-04-10 DIAGNOSIS — M5106 Intervertebral disc disorders with myelopathy, lumbar region: Secondary | ICD-10-CM | POA: Diagnosis not present

## 2020-04-10 DIAGNOSIS — M545 Low back pain, unspecified: Secondary | ICD-10-CM | POA: Diagnosis not present

## 2020-04-20 DIAGNOSIS — M48062 Spinal stenosis, lumbar region with neurogenic claudication: Secondary | ICD-10-CM | POA: Diagnosis not present

## 2020-04-20 DIAGNOSIS — Z01810 Encounter for preprocedural cardiovascular examination: Secondary | ICD-10-CM | POA: Diagnosis not present

## 2020-04-20 DIAGNOSIS — Z01812 Encounter for preprocedural laboratory examination: Secondary | ICD-10-CM | POA: Diagnosis not present

## 2020-04-27 DIAGNOSIS — M7138 Other bursal cyst, other site: Secondary | ICD-10-CM | POA: Diagnosis not present

## 2020-04-27 DIAGNOSIS — Z20822 Contact with and (suspected) exposure to covid-19: Secondary | ICD-10-CM | POA: Diagnosis not present

## 2020-04-27 DIAGNOSIS — M4716 Other spondylosis with myelopathy, lumbar region: Secondary | ICD-10-CM | POA: Diagnosis not present

## 2020-04-27 DIAGNOSIS — Q7649 Other congenital malformations of spine, not associated with scoliosis: Secondary | ICD-10-CM | POA: Diagnosis not present

## 2020-04-27 DIAGNOSIS — M5127 Other intervertebral disc displacement, lumbosacral region: Secondary | ICD-10-CM | POA: Diagnosis not present

## 2020-04-27 DIAGNOSIS — M5106 Intervertebral disc disorders with myelopathy, lumbar region: Secondary | ICD-10-CM | POA: Diagnosis not present

## 2020-04-27 DIAGNOSIS — M48062 Spinal stenosis, lumbar region with neurogenic claudication: Secondary | ICD-10-CM | POA: Diagnosis not present

## 2020-04-27 DIAGNOSIS — M545 Low back pain, unspecified: Secondary | ICD-10-CM | POA: Diagnosis not present

## 2020-04-27 DIAGNOSIS — Z981 Arthrodesis status: Secondary | ICD-10-CM | POA: Diagnosis not present

## 2020-04-27 DIAGNOSIS — M532X6 Spinal instabilities, lumbar region: Secondary | ICD-10-CM | POA: Diagnosis not present

## 2020-04-27 DIAGNOSIS — M5117 Intervertebral disc disorders with radiculopathy, lumbosacral region: Secondary | ICD-10-CM | POA: Diagnosis not present

## 2020-04-27 DIAGNOSIS — M4326 Fusion of spine, lumbar region: Secondary | ICD-10-CM | POA: Diagnosis not present

## 2020-04-27 DIAGNOSIS — M5137 Other intervertebral disc degeneration, lumbosacral region: Secondary | ICD-10-CM | POA: Diagnosis not present

## 2020-04-28 DIAGNOSIS — Z20822 Contact with and (suspected) exposure to covid-19: Secondary | ICD-10-CM | POA: Diagnosis not present

## 2020-04-28 DIAGNOSIS — M532X6 Spinal instabilities, lumbar region: Secondary | ICD-10-CM | POA: Diagnosis not present

## 2020-04-28 DIAGNOSIS — M7138 Other bursal cyst, other site: Secondary | ICD-10-CM | POA: Diagnosis not present

## 2020-04-28 DIAGNOSIS — Z981 Arthrodesis status: Secondary | ICD-10-CM | POA: Diagnosis not present

## 2020-04-28 DIAGNOSIS — M5117 Intervertebral disc disorders with radiculopathy, lumbosacral region: Secondary | ICD-10-CM | POA: Diagnosis not present

## 2020-04-28 DIAGNOSIS — M5137 Other intervertebral disc degeneration, lumbosacral region: Secondary | ICD-10-CM | POA: Diagnosis not present

## 2020-04-28 DIAGNOSIS — M48062 Spinal stenosis, lumbar region with neurogenic claudication: Secondary | ICD-10-CM | POA: Diagnosis not present

## 2020-04-28 DIAGNOSIS — M47816 Spondylosis without myelopathy or radiculopathy, lumbar region: Secondary | ICD-10-CM | POA: Diagnosis not present

## 2020-04-28 DIAGNOSIS — Q7649 Other congenital malformations of spine, not associated with scoliosis: Secondary | ICD-10-CM | POA: Diagnosis not present

## 2020-04-28 DIAGNOSIS — M4716 Other spondylosis with myelopathy, lumbar region: Secondary | ICD-10-CM | POA: Diagnosis not present

## 2020-04-29 DIAGNOSIS — M48062 Spinal stenosis, lumbar region with neurogenic claudication: Secondary | ICD-10-CM | POA: Diagnosis not present

## 2020-04-29 DIAGNOSIS — Z20822 Contact with and (suspected) exposure to covid-19: Secondary | ICD-10-CM | POA: Diagnosis not present

## 2020-04-29 DIAGNOSIS — M5137 Other intervertebral disc degeneration, lumbosacral region: Secondary | ICD-10-CM | POA: Diagnosis not present

## 2020-04-29 DIAGNOSIS — M5117 Intervertebral disc disorders with radiculopathy, lumbosacral region: Secondary | ICD-10-CM | POA: Diagnosis not present

## 2020-04-29 DIAGNOSIS — Q7649 Other congenital malformations of spine, not associated with scoliosis: Secondary | ICD-10-CM | POA: Diagnosis not present

## 2020-04-29 DIAGNOSIS — Z981 Arthrodesis status: Secondary | ICD-10-CM | POA: Diagnosis not present

## 2020-04-29 DIAGNOSIS — M7138 Other bursal cyst, other site: Secondary | ICD-10-CM | POA: Diagnosis not present

## 2020-04-29 DIAGNOSIS — M532X6 Spinal instabilities, lumbar region: Secondary | ICD-10-CM | POA: Diagnosis not present

## 2020-04-29 DIAGNOSIS — M4716 Other spondylosis with myelopathy, lumbar region: Secondary | ICD-10-CM | POA: Diagnosis not present

## 2020-04-30 DIAGNOSIS — M48062 Spinal stenosis, lumbar region with neurogenic claudication: Secondary | ICD-10-CM | POA: Diagnosis not present

## 2020-04-30 DIAGNOSIS — M4716 Other spondylosis with myelopathy, lumbar region: Secondary | ICD-10-CM | POA: Diagnosis not present

## 2020-04-30 DIAGNOSIS — Z20822 Contact with and (suspected) exposure to covid-19: Secondary | ICD-10-CM | POA: Diagnosis not present

## 2020-04-30 DIAGNOSIS — M5117 Intervertebral disc disorders with radiculopathy, lumbosacral region: Secondary | ICD-10-CM | POA: Diagnosis not present

## 2020-04-30 DIAGNOSIS — Z981 Arthrodesis status: Secondary | ICD-10-CM | POA: Diagnosis not present

## 2020-04-30 DIAGNOSIS — M5137 Other intervertebral disc degeneration, lumbosacral region: Secondary | ICD-10-CM | POA: Diagnosis not present

## 2020-04-30 DIAGNOSIS — M532X6 Spinal instabilities, lumbar region: Secondary | ICD-10-CM | POA: Diagnosis not present

## 2020-04-30 DIAGNOSIS — Q7649 Other congenital malformations of spine, not associated with scoliosis: Secondary | ICD-10-CM | POA: Diagnosis not present

## 2020-04-30 DIAGNOSIS — M7138 Other bursal cyst, other site: Secondary | ICD-10-CM | POA: Diagnosis not present

## 2020-05-01 DIAGNOSIS — M48062 Spinal stenosis, lumbar region with neurogenic claudication: Secondary | ICD-10-CM | POA: Diagnosis not present

## 2020-05-01 DIAGNOSIS — M961 Postlaminectomy syndrome, not elsewhere classified: Secondary | ICD-10-CM | POA: Diagnosis not present

## 2020-05-01 DIAGNOSIS — Z981 Arthrodesis status: Secondary | ICD-10-CM | POA: Diagnosis not present

## 2020-05-01 DIAGNOSIS — M4716 Other spondylosis with myelopathy, lumbar region: Secondary | ICD-10-CM | POA: Diagnosis not present

## 2020-05-01 DIAGNOSIS — M5416 Radiculopathy, lumbar region: Secondary | ICD-10-CM | POA: Diagnosis not present

## 2020-05-01 DIAGNOSIS — Z20822 Contact with and (suspected) exposure to covid-19: Secondary | ICD-10-CM | POA: Diagnosis not present

## 2020-05-01 DIAGNOSIS — M549 Dorsalgia, unspecified: Secondary | ICD-10-CM | POA: Diagnosis not present

## 2020-05-01 DIAGNOSIS — G8918 Other acute postprocedural pain: Secondary | ICD-10-CM | POA: Diagnosis not present

## 2020-05-01 DIAGNOSIS — K2100 Gastro-esophageal reflux disease with esophagitis, without bleeding: Secondary | ICD-10-CM | POA: Diagnosis not present

## 2020-05-01 DIAGNOSIS — M62838 Other muscle spasm: Secondary | ICD-10-CM | POA: Diagnosis not present

## 2020-05-01 DIAGNOSIS — M532X6 Spinal instabilities, lumbar region: Secondary | ICD-10-CM | POA: Diagnosis not present

## 2020-05-01 DIAGNOSIS — I10 Essential (primary) hypertension: Secondary | ICD-10-CM | POA: Diagnosis not present

## 2020-05-01 DIAGNOSIS — M5117 Intervertebral disc disorders with radiculopathy, lumbosacral region: Secondary | ICD-10-CM | POA: Diagnosis not present

## 2020-05-01 DIAGNOSIS — M5137 Other intervertebral disc degeneration, lumbosacral region: Secondary | ICD-10-CM | POA: Diagnosis not present

## 2020-05-01 DIAGNOSIS — K219 Gastro-esophageal reflux disease without esophagitis: Secondary | ICD-10-CM | POA: Diagnosis not present

## 2020-05-01 DIAGNOSIS — Z4789 Encounter for other orthopedic aftercare: Secondary | ICD-10-CM | POA: Diagnosis not present

## 2020-05-01 DIAGNOSIS — M7138 Other bursal cyst, other site: Secondary | ICD-10-CM | POA: Diagnosis not present

## 2020-05-01 DIAGNOSIS — E785 Hyperlipidemia, unspecified: Secondary | ICD-10-CM | POA: Diagnosis not present

## 2020-05-01 DIAGNOSIS — Q7649 Other congenital malformations of spine, not associated with scoliosis: Secondary | ICD-10-CM | POA: Diagnosis not present

## 2020-05-04 DIAGNOSIS — K2100 Gastro-esophageal reflux disease with esophagitis, without bleeding: Secondary | ICD-10-CM | POA: Diagnosis not present

## 2020-05-04 DIAGNOSIS — I10 Essential (primary) hypertension: Secondary | ICD-10-CM | POA: Diagnosis not present

## 2020-05-04 DIAGNOSIS — E785 Hyperlipidemia, unspecified: Secondary | ICD-10-CM | POA: Diagnosis not present

## 2020-05-04 DIAGNOSIS — M961 Postlaminectomy syndrome, not elsewhere classified: Secondary | ICD-10-CM | POA: Diagnosis not present

## 2020-05-04 DIAGNOSIS — M48062 Spinal stenosis, lumbar region with neurogenic claudication: Secondary | ICD-10-CM | POA: Diagnosis not present

## 2020-05-07 DIAGNOSIS — M549 Dorsalgia, unspecified: Secondary | ICD-10-CM | POA: Diagnosis not present

## 2020-05-07 DIAGNOSIS — G8918 Other acute postprocedural pain: Secondary | ICD-10-CM | POA: Diagnosis not present

## 2020-05-07 DIAGNOSIS — M5416 Radiculopathy, lumbar region: Secondary | ICD-10-CM | POA: Diagnosis not present

## 2020-05-07 DIAGNOSIS — M62838 Other muscle spasm: Secondary | ICD-10-CM | POA: Diagnosis not present

## 2020-05-13 DIAGNOSIS — K219 Gastro-esophageal reflux disease without esophagitis: Secondary | ICD-10-CM | POA: Diagnosis not present

## 2020-05-13 DIAGNOSIS — E785 Hyperlipidemia, unspecified: Secondary | ICD-10-CM | POA: Diagnosis not present

## 2020-05-13 DIAGNOSIS — I10 Essential (primary) hypertension: Secondary | ICD-10-CM | POA: Diagnosis not present

## 2020-05-14 ENCOUNTER — Other Ambulatory Visit: Payer: Self-pay

## 2020-05-14 NOTE — Patient Outreach (Signed)
[facility]) Care Management  05/14/2020  Nicole Herrera [birth year] [id number]   Referral Date: 05/14/20 Referral Source: [employer] Report Date of Discharge: 05/13/20 Facility:  [place]: [facility]   Referral received.  No outreach warranted at this time.  Transition of Care calls being completed via EMMI. RN CM will outreach patient for any red flags received.    Plan: RN CM will close case.    Jone Baseman, RN, MSN [facility] Care Management Care Management Coordinator Direct Line [phone] Toll Free: [phone]  Fax: [phone]

## 2020-05-18 ENCOUNTER — Other Ambulatory Visit: Payer: Self-pay

## 2020-05-18 DIAGNOSIS — Z87891 Personal history of nicotine dependence: Secondary | ICD-10-CM | POA: Diagnosis not present

## 2020-05-18 DIAGNOSIS — I10 Essential (primary) hypertension: Secondary | ICD-10-CM | POA: Diagnosis not present

## 2020-05-18 DIAGNOSIS — J309 Allergic rhinitis, unspecified: Secondary | ICD-10-CM | POA: Diagnosis not present

## 2020-05-18 DIAGNOSIS — Z981 Arthrodesis status: Secondary | ICD-10-CM | POA: Diagnosis not present

## 2020-05-18 DIAGNOSIS — J329 Chronic sinusitis, unspecified: Secondary | ICD-10-CM | POA: Diagnosis not present

## 2020-05-18 DIAGNOSIS — K219 Gastro-esophageal reflux disease without esophagitis: Secondary | ICD-10-CM | POA: Diagnosis not present

## 2020-05-18 DIAGNOSIS — E785 Hyperlipidemia, unspecified: Secondary | ICD-10-CM | POA: Diagnosis not present

## 2020-05-18 DIAGNOSIS — Z4789 Encounter for other orthopedic aftercare: Secondary | ICD-10-CM | POA: Diagnosis not present

## 2020-05-18 DIAGNOSIS — K2270 Barrett's esophagus without dysplasia: Secondary | ICD-10-CM | POA: Diagnosis not present

## 2020-05-18 NOTE — Patient Outreach (Signed)
[facility]) Care Management  05/18/2020  LUV MISH 1945-07-13 [id number]   EMMI- General Discharge RED ON EMMI ALERT Day # 1 Date:  05/16/20 Red Alert Reason:  Got discharge papers? I Don't Know  Know who to call about changes in condition? No    Outreach attempt: Telephone call to patient for EMMI follow up. Patient declined to participate or verify HIPAA.  CM ended the call.    Plan: RN CM will close case.   Bary Leriche, RN, MSN [facility] Care Management Care Management Coordinator Direct Line [phone] Toll Free: [phone]  Fax: [phone]

## 2020-05-21 DIAGNOSIS — J309 Allergic rhinitis, unspecified: Secondary | ICD-10-CM | POA: Diagnosis not present

## 2020-05-21 DIAGNOSIS — Z981 Arthrodesis status: Secondary | ICD-10-CM | POA: Diagnosis not present

## 2020-05-21 DIAGNOSIS — E785 Hyperlipidemia, unspecified: Secondary | ICD-10-CM | POA: Diagnosis not present

## 2020-05-21 DIAGNOSIS — Z4789 Encounter for other orthopedic aftercare: Secondary | ICD-10-CM | POA: Diagnosis not present

## 2020-05-21 DIAGNOSIS — K219 Gastro-esophageal reflux disease without esophagitis: Secondary | ICD-10-CM | POA: Diagnosis not present

## 2020-05-21 DIAGNOSIS — K2270 Barrett's esophagus without dysplasia: Secondary | ICD-10-CM | POA: Diagnosis not present

## 2020-05-21 DIAGNOSIS — Z87891 Personal history of nicotine dependence: Secondary | ICD-10-CM | POA: Diagnosis not present

## 2020-05-21 DIAGNOSIS — J329 Chronic sinusitis, unspecified: Secondary | ICD-10-CM | POA: Diagnosis not present

## 2020-05-21 DIAGNOSIS — I10 Essential (primary) hypertension: Secondary | ICD-10-CM | POA: Diagnosis not present

## 2020-05-26 DIAGNOSIS — E785 Hyperlipidemia, unspecified: Secondary | ICD-10-CM | POA: Diagnosis not present

## 2020-05-26 DIAGNOSIS — I10 Essential (primary) hypertension: Secondary | ICD-10-CM | POA: Diagnosis not present

## 2020-05-26 DIAGNOSIS — Z981 Arthrodesis status: Secondary | ICD-10-CM | POA: Diagnosis not present

## 2020-05-26 DIAGNOSIS — J329 Chronic sinusitis, unspecified: Secondary | ICD-10-CM | POA: Diagnosis not present

## 2020-05-26 DIAGNOSIS — J309 Allergic rhinitis, unspecified: Secondary | ICD-10-CM | POA: Diagnosis not present

## 2020-05-26 DIAGNOSIS — Z4789 Encounter for other orthopedic aftercare: Secondary | ICD-10-CM | POA: Diagnosis not present

## 2020-05-26 DIAGNOSIS — K219 Gastro-esophageal reflux disease without esophagitis: Secondary | ICD-10-CM | POA: Diagnosis not present

## 2020-05-26 DIAGNOSIS — Z87891 Personal history of nicotine dependence: Secondary | ICD-10-CM | POA: Diagnosis not present

## 2020-05-26 DIAGNOSIS — M4326 Fusion of spine, lumbar region: Secondary | ICD-10-CM | POA: Diagnosis not present

## 2020-05-26 DIAGNOSIS — M48062 Spinal stenosis, lumbar region with neurogenic claudication: Secondary | ICD-10-CM | POA: Diagnosis not present

## 2020-05-26 DIAGNOSIS — K2270 Barrett's esophagus without dysplasia: Secondary | ICD-10-CM | POA: Diagnosis not present

## 2020-05-29 DIAGNOSIS — J309 Allergic rhinitis, unspecified: Secondary | ICD-10-CM | POA: Diagnosis not present

## 2020-05-29 DIAGNOSIS — K219 Gastro-esophageal reflux disease without esophagitis: Secondary | ICD-10-CM | POA: Diagnosis not present

## 2020-05-29 DIAGNOSIS — Z981 Arthrodesis status: Secondary | ICD-10-CM | POA: Diagnosis not present

## 2020-05-29 DIAGNOSIS — Z4789 Encounter for other orthopedic aftercare: Secondary | ICD-10-CM | POA: Diagnosis not present

## 2020-05-29 DIAGNOSIS — E785 Hyperlipidemia, unspecified: Secondary | ICD-10-CM | POA: Diagnosis not present

## 2020-05-29 DIAGNOSIS — I10 Essential (primary) hypertension: Secondary | ICD-10-CM | POA: Diagnosis not present

## 2020-05-29 DIAGNOSIS — K2270 Barrett's esophagus without dysplasia: Secondary | ICD-10-CM | POA: Diagnosis not present

## 2020-05-29 DIAGNOSIS — Z87891 Personal history of nicotine dependence: Secondary | ICD-10-CM | POA: Diagnosis not present

## 2020-05-29 DIAGNOSIS — J329 Chronic sinusitis, unspecified: Secondary | ICD-10-CM | POA: Diagnosis not present

## 2020-05-30 DIAGNOSIS — Z981 Arthrodesis status: Secondary | ICD-10-CM | POA: Diagnosis not present

## 2020-05-30 DIAGNOSIS — K219 Gastro-esophageal reflux disease without esophagitis: Secondary | ICD-10-CM | POA: Diagnosis not present

## 2020-05-30 DIAGNOSIS — Z4789 Encounter for other orthopedic aftercare: Secondary | ICD-10-CM | POA: Diagnosis not present

## 2020-05-30 DIAGNOSIS — K2270 Barrett's esophagus without dysplasia: Secondary | ICD-10-CM | POA: Diagnosis not present

## 2020-05-30 DIAGNOSIS — J309 Allergic rhinitis, unspecified: Secondary | ICD-10-CM | POA: Diagnosis not present

## 2020-05-30 DIAGNOSIS — Z87891 Personal history of nicotine dependence: Secondary | ICD-10-CM | POA: Diagnosis not present

## 2020-05-30 DIAGNOSIS — I10 Essential (primary) hypertension: Secondary | ICD-10-CM | POA: Diagnosis not present

## 2020-05-30 DIAGNOSIS — E785 Hyperlipidemia, unspecified: Secondary | ICD-10-CM | POA: Diagnosis not present

## 2020-05-30 DIAGNOSIS — J329 Chronic sinusitis, unspecified: Secondary | ICD-10-CM | POA: Diagnosis not present

## 2020-06-01 DIAGNOSIS — K2270 Barrett's esophagus without dysplasia: Secondary | ICD-10-CM | POA: Diagnosis not present

## 2020-06-01 DIAGNOSIS — Z981 Arthrodesis status: Secondary | ICD-10-CM | POA: Diagnosis not present

## 2020-06-01 DIAGNOSIS — Z4789 Encounter for other orthopedic aftercare: Secondary | ICD-10-CM | POA: Diagnosis not present

## 2020-06-01 DIAGNOSIS — K219 Gastro-esophageal reflux disease without esophagitis: Secondary | ICD-10-CM | POA: Diagnosis not present

## 2020-06-01 DIAGNOSIS — J329 Chronic sinusitis, unspecified: Secondary | ICD-10-CM | POA: Diagnosis not present

## 2020-06-01 DIAGNOSIS — I10 Essential (primary) hypertension: Secondary | ICD-10-CM | POA: Diagnosis not present

## 2020-06-01 DIAGNOSIS — J309 Allergic rhinitis, unspecified: Secondary | ICD-10-CM | POA: Diagnosis not present

## 2020-06-01 DIAGNOSIS — E785 Hyperlipidemia, unspecified: Secondary | ICD-10-CM | POA: Diagnosis not present

## 2020-06-01 DIAGNOSIS — Z87891 Personal history of nicotine dependence: Secondary | ICD-10-CM | POA: Diagnosis not present

## 2020-06-02 DIAGNOSIS — K2270 Barrett's esophagus without dysplasia: Secondary | ICD-10-CM | POA: Diagnosis not present

## 2020-06-02 DIAGNOSIS — E785 Hyperlipidemia, unspecified: Secondary | ICD-10-CM | POA: Diagnosis not present

## 2020-06-02 DIAGNOSIS — Z4789 Encounter for other orthopedic aftercare: Secondary | ICD-10-CM | POA: Diagnosis not present

## 2020-06-02 DIAGNOSIS — J329 Chronic sinusitis, unspecified: Secondary | ICD-10-CM | POA: Diagnosis not present

## 2020-06-02 DIAGNOSIS — Z981 Arthrodesis status: Secondary | ICD-10-CM | POA: Diagnosis not present

## 2020-06-02 DIAGNOSIS — Z87891 Personal history of nicotine dependence: Secondary | ICD-10-CM | POA: Diagnosis not present

## 2020-06-02 DIAGNOSIS — I10 Essential (primary) hypertension: Secondary | ICD-10-CM | POA: Diagnosis not present

## 2020-06-02 DIAGNOSIS — J309 Allergic rhinitis, unspecified: Secondary | ICD-10-CM | POA: Diagnosis not present

## 2020-06-02 DIAGNOSIS — K219 Gastro-esophageal reflux disease without esophagitis: Secondary | ICD-10-CM | POA: Diagnosis not present

## 2020-06-04 DIAGNOSIS — Z4789 Encounter for other orthopedic aftercare: Secondary | ICD-10-CM | POA: Diagnosis not present

## 2020-06-04 DIAGNOSIS — E785 Hyperlipidemia, unspecified: Secondary | ICD-10-CM | POA: Diagnosis not present

## 2020-06-04 DIAGNOSIS — K2270 Barrett's esophagus without dysplasia: Secondary | ICD-10-CM | POA: Diagnosis not present

## 2020-06-04 DIAGNOSIS — K219 Gastro-esophageal reflux disease without esophagitis: Secondary | ICD-10-CM | POA: Diagnosis not present

## 2020-06-04 DIAGNOSIS — J309 Allergic rhinitis, unspecified: Secondary | ICD-10-CM | POA: Diagnosis not present

## 2020-06-04 DIAGNOSIS — J329 Chronic sinusitis, unspecified: Secondary | ICD-10-CM | POA: Diagnosis not present

## 2020-06-04 DIAGNOSIS — Z981 Arthrodesis status: Secondary | ICD-10-CM | POA: Diagnosis not present

## 2020-06-04 DIAGNOSIS — I10 Essential (primary) hypertension: Secondary | ICD-10-CM | POA: Diagnosis not present

## 2020-06-04 DIAGNOSIS — Z87891 Personal history of nicotine dependence: Secondary | ICD-10-CM | POA: Diagnosis not present

## 2020-06-06 DIAGNOSIS — Z981 Arthrodesis status: Secondary | ICD-10-CM | POA: Diagnosis not present

## 2020-06-06 DIAGNOSIS — K219 Gastro-esophageal reflux disease without esophagitis: Secondary | ICD-10-CM | POA: Diagnosis not present

## 2020-06-06 DIAGNOSIS — E785 Hyperlipidemia, unspecified: Secondary | ICD-10-CM | POA: Diagnosis not present

## 2020-06-06 DIAGNOSIS — J309 Allergic rhinitis, unspecified: Secondary | ICD-10-CM | POA: Diagnosis not present

## 2020-06-06 DIAGNOSIS — I10 Essential (primary) hypertension: Secondary | ICD-10-CM | POA: Diagnosis not present

## 2020-06-06 DIAGNOSIS — Z4789 Encounter for other orthopedic aftercare: Secondary | ICD-10-CM | POA: Diagnosis not present

## 2020-06-06 DIAGNOSIS — K2270 Barrett's esophagus without dysplasia: Secondary | ICD-10-CM | POA: Diagnosis not present

## 2020-06-06 DIAGNOSIS — Z87891 Personal history of nicotine dependence: Secondary | ICD-10-CM | POA: Diagnosis not present

## 2020-06-06 DIAGNOSIS — J329 Chronic sinusitis, unspecified: Secondary | ICD-10-CM | POA: Diagnosis not present

## 2020-06-09 DIAGNOSIS — E785 Hyperlipidemia, unspecified: Secondary | ICD-10-CM | POA: Diagnosis not present

## 2020-06-09 DIAGNOSIS — Z87891 Personal history of nicotine dependence: Secondary | ICD-10-CM | POA: Diagnosis not present

## 2020-06-09 DIAGNOSIS — I10 Essential (primary) hypertension: Secondary | ICD-10-CM | POA: Diagnosis not present

## 2020-06-09 DIAGNOSIS — J309 Allergic rhinitis, unspecified: Secondary | ICD-10-CM | POA: Diagnosis not present

## 2020-06-09 DIAGNOSIS — Z4789 Encounter for other orthopedic aftercare: Secondary | ICD-10-CM | POA: Diagnosis not present

## 2020-06-09 DIAGNOSIS — K219 Gastro-esophageal reflux disease without esophagitis: Secondary | ICD-10-CM | POA: Diagnosis not present

## 2020-06-09 DIAGNOSIS — K2270 Barrett's esophagus without dysplasia: Secondary | ICD-10-CM | POA: Diagnosis not present

## 2020-06-09 DIAGNOSIS — Z981 Arthrodesis status: Secondary | ICD-10-CM | POA: Diagnosis not present

## 2020-06-09 DIAGNOSIS — J329 Chronic sinusitis, unspecified: Secondary | ICD-10-CM | POA: Diagnosis not present

## 2020-06-18 DIAGNOSIS — E785 Hyperlipidemia, unspecified: Secondary | ICD-10-CM | POA: Diagnosis not present

## 2020-06-18 DIAGNOSIS — Z1159 Encounter for screening for other viral diseases: Secondary | ICD-10-CM | POA: Diagnosis not present

## 2020-06-18 DIAGNOSIS — E2839 Other primary ovarian failure: Secondary | ICD-10-CM | POA: Diagnosis not present

## 2020-06-18 DIAGNOSIS — R609 Edema, unspecified: Secondary | ICD-10-CM | POA: Diagnosis not present

## 2020-06-18 DIAGNOSIS — K219 Gastro-esophageal reflux disease without esophagitis: Secondary | ICD-10-CM | POA: Diagnosis not present

## 2020-06-18 DIAGNOSIS — Z0000 Encounter for general adult medical examination without abnormal findings: Secondary | ICD-10-CM | POA: Diagnosis not present

## 2020-06-18 DIAGNOSIS — I10 Essential (primary) hypertension: Secondary | ICD-10-CM | POA: Diagnosis not present

## 2020-06-18 DIAGNOSIS — M5136 Other intervertebral disc degeneration, lumbar region: Secondary | ICD-10-CM | POA: Diagnosis not present

## 2020-06-19 ENCOUNTER — Other Ambulatory Visit: Payer: Self-pay | Admitting: Family Medicine

## 2020-06-19 DIAGNOSIS — K2270 Barrett's esophagus without dysplasia: Secondary | ICD-10-CM | POA: Diagnosis not present

## 2020-06-19 DIAGNOSIS — E785 Hyperlipidemia, unspecified: Secondary | ICD-10-CM | POA: Diagnosis not present

## 2020-06-19 DIAGNOSIS — Z981 Arthrodesis status: Secondary | ICD-10-CM | POA: Diagnosis not present

## 2020-06-19 DIAGNOSIS — Z1231 Encounter for screening mammogram for malignant neoplasm of breast: Secondary | ICD-10-CM

## 2020-06-19 DIAGNOSIS — Z87891 Personal history of nicotine dependence: Secondary | ICD-10-CM | POA: Diagnosis not present

## 2020-06-19 DIAGNOSIS — I10 Essential (primary) hypertension: Secondary | ICD-10-CM | POA: Diagnosis not present

## 2020-06-19 DIAGNOSIS — Z4789 Encounter for other orthopedic aftercare: Secondary | ICD-10-CM | POA: Diagnosis not present

## 2020-06-19 DIAGNOSIS — K219 Gastro-esophageal reflux disease without esophagitis: Secondary | ICD-10-CM | POA: Diagnosis not present

## 2020-06-19 DIAGNOSIS — Z780 Asymptomatic menopausal state: Secondary | ICD-10-CM

## 2020-06-19 DIAGNOSIS — E2839 Other primary ovarian failure: Secondary | ICD-10-CM

## 2020-06-19 DIAGNOSIS — J329 Chronic sinusitis, unspecified: Secondary | ICD-10-CM | POA: Diagnosis not present

## 2020-06-19 DIAGNOSIS — J309 Allergic rhinitis, unspecified: Secondary | ICD-10-CM | POA: Diagnosis not present

## 2020-06-23 DIAGNOSIS — J329 Chronic sinusitis, unspecified: Secondary | ICD-10-CM | POA: Diagnosis not present

## 2020-06-23 DIAGNOSIS — Z981 Arthrodesis status: Secondary | ICD-10-CM | POA: Diagnosis not present

## 2020-06-23 DIAGNOSIS — Z4789 Encounter for other orthopedic aftercare: Secondary | ICD-10-CM | POA: Diagnosis not present

## 2020-06-23 DIAGNOSIS — E785 Hyperlipidemia, unspecified: Secondary | ICD-10-CM | POA: Diagnosis not present

## 2020-06-23 DIAGNOSIS — K219 Gastro-esophageal reflux disease without esophagitis: Secondary | ICD-10-CM | POA: Diagnosis not present

## 2020-06-23 DIAGNOSIS — K2270 Barrett's esophagus without dysplasia: Secondary | ICD-10-CM | POA: Diagnosis not present

## 2020-06-23 DIAGNOSIS — I10 Essential (primary) hypertension: Secondary | ICD-10-CM | POA: Diagnosis not present

## 2020-06-23 DIAGNOSIS — Z87891 Personal history of nicotine dependence: Secondary | ICD-10-CM | POA: Diagnosis not present

## 2020-06-23 DIAGNOSIS — J309 Allergic rhinitis, unspecified: Secondary | ICD-10-CM | POA: Diagnosis not present

## 2020-06-25 DIAGNOSIS — Z79891 Long term (current) use of opiate analgesic: Secondary | ICD-10-CM | POA: Diagnosis not present

## 2020-06-25 DIAGNOSIS — Z79899 Other long term (current) drug therapy: Secondary | ICD-10-CM | POA: Diagnosis not present

## 2020-06-25 DIAGNOSIS — G894 Chronic pain syndrome: Secondary | ICD-10-CM | POA: Diagnosis not present

## 2020-07-06 DIAGNOSIS — E785 Hyperlipidemia, unspecified: Secondary | ICD-10-CM | POA: Diagnosis not present

## 2020-07-06 DIAGNOSIS — I10 Essential (primary) hypertension: Secondary | ICD-10-CM | POA: Diagnosis not present

## 2020-07-06 DIAGNOSIS — K219 Gastro-esophageal reflux disease without esophagitis: Secondary | ICD-10-CM | POA: Diagnosis not present

## 2020-07-13 ENCOUNTER — Encounter (HOSPITAL_COMMUNITY): Payer: Self-pay

## 2020-07-13 ENCOUNTER — Emergency Department (HOSPITAL_COMMUNITY)
Admission: EM | Admit: 2020-07-13 | Discharge: 2020-07-13 | Discharge status: Against Medical Advice | Payer: Medicare HMO | Attending: Emergency Medicine | Admitting: Emergency Medicine

## 2020-07-13 ENCOUNTER — Emergency Department (HOSPITAL_COMMUNITY): Payer: Medicare HMO

## 2020-07-13 ENCOUNTER — Other Ambulatory Visit: Payer: Self-pay

## 2020-07-13 DIAGNOSIS — R479 Unspecified speech disturbances: Secondary | ICD-10-CM

## 2020-07-13 DIAGNOSIS — R202 Paresthesia of skin: Secondary | ICD-10-CM | POA: Diagnosis not present

## 2020-07-13 DIAGNOSIS — I10 Essential (primary) hypertension: Secondary | ICD-10-CM | POA: Diagnosis not present

## 2020-07-13 DIAGNOSIS — Z79899 Other long term (current) drug therapy: Secondary | ICD-10-CM | POA: Diagnosis not present

## 2020-07-13 DIAGNOSIS — R531 Weakness: Secondary | ICD-10-CM | POA: Diagnosis present

## 2020-07-13 DIAGNOSIS — G459 Transient cerebral ischemic attack, unspecified: Secondary | ICD-10-CM

## 2020-07-13 LAB — DIFFERENTIAL
Abs Immature Granulocytes: 0.01 10*3/uL (ref 0.00–0.07)
Basophils Absolute: 0 10*3/uL (ref 0.0–0.1)
Basophils Relative: 1 %
Eosinophils Absolute: 0.2 10*3/uL (ref 0.0–0.5)
Eosinophils Relative: 4 %
Immature Granulocytes: 0 %
Lymphocytes Relative: 26 %
Lymphs Abs: 1.5 10*3/uL (ref 0.7–4.0)
Monocytes Absolute: 0.4 10*3/uL (ref 0.1–1.0)
Monocytes Relative: 7 %
Neutro Abs: 3.6 10*3/uL (ref 1.7–7.7)
Neutrophils Relative %: 62 %

## 2020-07-13 LAB — COMPREHENSIVE METABOLIC PANEL
ALT: 17 U/L (ref 0–44)
AST: 20 U/L (ref 15–41)
Albumin: 4.3 g/dL (ref 3.5–5.0)
Alkaline Phosphatase: 91 U/L (ref 38–126)
Anion gap: 9 (ref 5–15)
BUN: 16 mg/dL (ref 8–23)
CO2: 25 mmol/L (ref 22–32)
Calcium: 9.3 mg/dL (ref 8.9–10.3)
Chloride: 98 mmol/L (ref 98–111)
Creatinine, Ser: 0.64 mg/dL (ref 0.44–1.00)
GFR, Estimated: >60 mL/min (ref >60)
Glucose, Bld: 89 mg/dL (ref 70–99)
Potassium: 4.1 mmol/L (ref 3.5–5.1)
Sodium: 132 mmol/L — ABNORMAL LOW (ref 135–145)
Total Bilirubin: 0.6 mg/dL (ref 0.3–1.2)
Total Protein: 7.6 g/dL (ref 6.5–8.1)

## 2020-07-13 LAB — URINALYSIS, ROUTINE W REFLEX MICROSCOPIC
Bilirubin Urine: NEGATIVE
Glucose, UA: NEGATIVE mg/dL
Hgb urine dipstick: NEGATIVE
Ketones, ur: NEGATIVE mg/dL
Leukocytes,Ua: NEGATIVE
Nitrite: NEGATIVE
Protein, ur: NEGATIVE mg/dL
Specific Gravity, Urine: 1.004 — ABNORMAL LOW (ref 1.005–1.030)
pH: 5 (ref 5.0–8.0)

## 2020-07-13 LAB — CBG MONITORING, ED: Glucose-Capillary: 94 mg/dL (ref 70–99)

## 2020-07-13 LAB — CBC
HCT: 30.8 % — ABNORMAL LOW (ref 36.0–46.0)
Hemoglobin: 9.6 g/dL — ABNORMAL LOW (ref 12.0–15.0)
MCH: 24.1 pg — ABNORMAL LOW (ref 26.0–34.0)
MCHC: 31.2 g/dL (ref 30.0–36.0)
MCV: 77.2 fL — ABNORMAL LOW (ref 80.0–100.0)
Platelets: 348 10*3/uL (ref 150–400)
RBC: 3.99 MIL/uL (ref 3.87–5.11)
RDW: 15 % (ref 11.5–15.5)
WBC: 5.7 10*3/uL (ref 4.0–10.5)
nRBC: 0 % (ref 0.0–0.2)

## 2020-07-13 LAB — APTT: aPTT: 29 seconds (ref 24–36)

## 2020-07-13 LAB — PROTIME-INR
INR: 1.1 (ref 0.8–1.2)
Prothrombin Time: 13.4 seconds (ref 11.4–15.2)

## 2020-07-13 MED ORDER — SODIUM CHLORIDE 0.9% FLUSH: 3.0000 mL | Freq: Once | INTRAVENOUS | Status: DC

## 2020-07-13 NOTE — ED Notes (Signed)
Pt spoke w MD ab leaving AMA, risks vs benefits, pt understood and wants to go home- advised to follow up w neurology. Pt friend is driving her home, pt has steady gait, A&O X 4, ready for dc

## 2020-07-13 NOTE — ED Triage Notes (Signed)
Pt arrived via walk in, concerned she was having a stroke, states she was stuttering this morning with her words. Was having tingling and weakness in bilateral arms. Grip strengths equal, no facial droop.

## 2020-07-13 NOTE — ED Notes (Signed)
Pt c/o severe back pain from recent back surgery, states she wants to leave. Advised pt to wait and speak with MD. Pt stated "if she comes within 30 minutes, but if not, I'm leaving". MD made aware

## 2020-07-13 NOTE — ED Notes (Signed)
MD at bedside. 

## 2020-07-13 NOTE — ED Provider Notes (Signed)
[facility] DEPT Provider Note   CSN: [id number] Arrival date & time: 07/13/20  1152     History Chief Complaint  Patient presents with  . Weakness    Nicole Herrera is a 75 y.o. female.  HPI      Around 7-8AM Picked up coffee cup and felt like cup was breaking or bending, thought she didn't have control of it (with left hand) as holding handle had the sensation that it was breaking and needed to react.  Didn't drop it, didn't feel weak or numb but had sense that she "didn't have it" or was "losing it" Then stuttered with someone on the phone, felt different than just repeating Called PCP Dr. Patrice Paradise spine specialist(lumbar spine), does have back spasm hx Says she is left handed, but maybe a little bit of both Both happened this AM, not sure if it was the same exact time but at least close One sentence felt like could not get the words out  Otherwise didn't have speech problems. Drooping of face, visual changes, difficulty walking (other than baseline back), Tingling left arm  Sleeping in recliner because of back spasms  No smoking,occ wine, no drugs No hx of CVA or TIA Grandmother had stroke hx   Past Medical History:  Diagnosis Date  . Acid reflux   . Barrett's esophagus   . Hyperlipemia   . Hypertension   . Migraine   . Neck pain   . Sinusitis   . Tinnitus   . Trigger finger   . Vertigo     Patient Active Problem List   Diagnosis Date Noted  . Dizziness 05/10/2016    Past Surgical History:  Procedure Laterality Date  . ABDOMINAL HYSTERECTOMY    . NASAL SINUS SURGERY    . NECK SURGERY    . TRIGGER FINGER RELEASE       OB History   No obstetric history on file.     Family History  Problem Relation Age of Onset  . Alzheimer's disease Mother   . Alcoholism Father   . Emphysema Father   . Heart disease Father   . Diabetes Brother   . Diabetes Maternal Grandmother     Social History   Tobacco Use  . Smoking  status: Former Research scientist (life sciences)  . Smokeless tobacco: Never Used  Substance Use Topics  . Alcohol use: Yes    Comment: "very little"   . Drug use: No    Home Medications Prior to Admission medications   Medication Sig Start Date End Date Taking? Authorizing Provider  atenolol (TENORMIN) 25 MG tablet Taking 3 tablets in the morning and 1 tablet in the afternoon. 03/13/16   [provider]  fluticasone (FLONASE) 50 MCG/ACT nasal spray as needed. 01/25/14   [provider]  ibuprofen (ADVIL,MOTRIN) 200 MG tablet Take 200 mg by mouth as needed.    [provider]  loratadine (CLARITIN) 10 MG tablet Take by mouth as needed.    [provider]  pantoprazole (PROTONIX) 40 MG tablet as needed. 10/12/14   [provider]  PREMARIN 0.3 MG tablet as needed.  04/20/16   [provider]  traMADol (ULTRAM) 50 MG tablet as needed. 04/11/16   [provider]    Allergies    Diphenhydramine, Morphine, Amlodipine besylate, and Valsartan  Review of Systems   Review of Systems  Constitutional: Negative for fever.  Eyes: Negative for visual disturbance.  Respiratory: Negative for cough and shortness of breath.  Cardiovascular: Negative for chest pain.  Gastrointestinal: Negative for nausea and vomiting.  Genitourinary: Negative for dysuria.  Musculoskeletal: Positive for back pain.  Skin: Negative for rash.  Neurological: Positive for speech difficulty and numbness (tingling). Negative for facial asymmetry and headaches.    Physical Exam Updated Vital Signs BP 140/82 (BP Location: Left Arm)   Pulse 75   Temp 98.6 F (37 C) (Oral)   Resp 18   Ht 5' 6.5" (1.689 m)   Wt 99.8 kg   SpO2 100%   BMI 34.98 kg/m   Physical Exam Vitals and nursing note reviewed.  Constitutional:      General: She is not in acute distress.    Appearance: She is well-developed and well-nourished. She is not diaphoretic.  HENT:     Head: Normocephalic and  atraumatic.  Eyes:     Extraocular Movements: EOM normal.     Conjunctiva/sclera: Conjunctivae normal.  Cardiovascular:     Rate and Rhythm: Normal rate and regular rhythm.     Pulses: Intact distal pulses.     Heart sounds: Normal heart sounds. No murmur heard. No friction rub. No gallop.   Pulmonary:     Effort: Pulmonary effort is normal. No respiratory distress.     Breath sounds: Normal breath sounds. No wheezing or rales.  Abdominal:     General: There is no distension.     Palpations: Abdomen is soft.     Tenderness: There is no abdominal tenderness. There is no guarding.  Musculoskeletal:        General: No tenderness or edema.     Cervical back: Normal range of motion.  Skin:    General: Skin is warm and dry.     Findings: No erythema or rash.  Neurological:     Mental Status: She is alert and oriented to person, place, and time.     ED Results / Procedures / Treatments   Labs (all labs ordered are listed, but only abnormal results are displayed) Labs Reviewed  CBC - Abnormal; Notable for the following components:      Result Value   Hemoglobin 9.6 (*)    HCT 30.8 (*)    MCV 77.2 (*)    MCH 24.1 (*)    All other components within normal limits  URINALYSIS, ROUTINE W REFLEX MICROSCOPIC - Abnormal; Notable for the following components:   Color, Urine STRAW (*)    Specific Gravity, Urine 1.004 (*)    All other components within normal limits  COMPREHENSIVE METABOLIC PANEL - Abnormal; Notable for the following components:   Sodium 132 (*)    All other components within normal limits  PROTIME-INR  APTT  DIFFERENTIAL  CBG MONITORING, ED  CBG MONITORING, ED    EKG EKG Interpretation  Date/Time:  Monday July 13 2020 12:14:53 EST Ventricular Rate:  88 PR Interval:    QRS Duration: 97 QT Interval:  383 QTC Calculation: 464 R Axis:   78 Text Interpretation: Sinus rhythm 12 Lead; Mason-Likar No significant change since last tracing Confirmed by  Gareth Morgan [phone]) on 07/13/2020 4:35:40 PM   Radiology CT HEAD WO CONTRAST  Result Date: 07/13/2020 CLINICAL DATA:  Concern for stroke, states she was stuttering this morning with her words. Was having tingling and weakness in bilateral arms. Grip strengths equal, no facial droop EXAM: CT HEAD WITHOUT CONTRAST TECHNIQUE: Contiguous axial images were obtained from the base of the skull through the vertex without intravenous contrast. COMPARISON:  MRI brain April 20, 2016. FINDINGS: Brain: No evidence of acute large vascular territory infarction, hemorrhage, hydrocephalus, extra-axial collection or mass lesion/mass effect. Mild age appropriate global parenchymal volume loss with ex vacuo dilatation of the ventricular system. Vascular: No hyperdense vessel. Atherosclerotic calcifications of the internal carotid arteries. Skull: Normal. Negative for fracture or focal lesion. Sinuses/Orbits: No acute finding. Other: None. IMPRESSION: No acute intracranial abnormality. Electronically Signed   By: Dahlia Bailiff MD   On: 07/13/2020 13:23    Procedures Procedures   Medications Ordered in ED Medications - No data to display  ED Course  I have reviewed the triage vital signs and the nursing notes.  Pertinent labs & imaging results that were available during my care of the patient were reviewed by me and considered in my medical decision making (see chart for details).    MDM Rules/Calculators/A&P                          75yo female with history of hypertension, hyperlipidemia, migraine, presents with concern for episode of abnormal sensation in left hand/arm and difficulty speaking.  CT head and labwork without significant abnormalities.  No abnormalities on history or exam at this time.  Discussed concern that episode represents TIA or possible CVA and recommend further evaluation.  She declines this and states she would like to leave against medical advice. She understands the risk of  stroke, death, disability and has the capacity to make this decision. Given number for Neurology and recommend PCP/Neuro follow up or return to the ED for further evaluation.  Final Clinical Impression(s) / ED Diagnoses Final diagnoses:  TIA (transient ischemic attack)  Difficulty speaking    Rx / DC Orders ED Discharge Orders    None       Gareth Morgan, MD 07/14/20 1127

## 2020-07-22 DIAGNOSIS — M4326 Fusion of spine, lumbar region: Secondary | ICD-10-CM | POA: Diagnosis not present

## 2020-08-11 ENCOUNTER — Encounter: Payer: Self-pay | Admitting: Physical Therapy

## 2020-08-11 ENCOUNTER — Ambulatory Visit: Payer: Medicare HMO | Attending: Orthopaedic Surgery | Admitting: Physical Therapy

## 2020-08-11 ENCOUNTER — Other Ambulatory Visit: Payer: Self-pay

## 2020-08-11 DIAGNOSIS — M545 Low back pain, unspecified: Secondary | ICD-10-CM

## 2020-08-11 DIAGNOSIS — G8929 Other chronic pain: Secondary | ICD-10-CM | POA: Diagnosis not present

## 2020-08-11 DIAGNOSIS — M62830 Muscle spasm of back: Secondary | ICD-10-CM | POA: Insufficient documentation

## 2020-08-11 DIAGNOSIS — M6281 Muscle weakness (generalized): Secondary | ICD-10-CM | POA: Diagnosis not present

## 2020-08-11 DIAGNOSIS — M5416 Radiculopathy, lumbar region: Secondary | ICD-10-CM | POA: Insufficient documentation

## 2020-08-12 ENCOUNTER — Ambulatory Visit (INDEPENDENT_AMBULATORY_CARE_PROVIDER_SITE_OTHER): Payer: Medicare HMO

## 2020-08-12 ENCOUNTER — Encounter: Payer: Self-pay | Admitting: Physical Therapy

## 2020-08-12 DIAGNOSIS — E2839 Other primary ovarian failure: Secondary | ICD-10-CM | POA: Diagnosis not present

## 2020-08-12 DIAGNOSIS — Z1231 Encounter for screening mammogram for malignant neoplasm of breast: Secondary | ICD-10-CM | POA: Diagnosis not present

## 2020-08-12 DIAGNOSIS — Z780 Asymptomatic menopausal state: Secondary | ICD-10-CM

## 2020-08-12 NOTE — Therapy (Addendum)
[place], [place], [address] Phone: [phone]   Fax:  [phone]  Physical Therapy Evaluation  Patient Details  Name: Nicole Herrera MRN: [id number] Date of Birth: 06/25/1945 Referring Provider (PT): Starling Manns, MD  Encounter Date: 08/11/2020   PT End of Session - 08/11/20 1710     Visit Number 1    Number of Visits 10    Date for PT Re-Evaluation 10/06/20    Authorization Type [employer] MCR    Authorization Time Period FOTO by 6th    Progress Note Due on Visit 10    PT Start Time 1615    PT Stop Time 1705    PT Time Calculation (min) 50 min    Activity Tolerance Patient tolerated treatment well;Patient limited by pain    Behavior During Therapy [facility] for tasks assessed/performed             Past Medical History:  Diagnosis Date   Acid reflux    Barrett's esophagus    Hyperlipemia    Hypertension    Migraine    Neck pain    Sinusitis    Tinnitus    Trigger finger    Vertigo     Past Surgical History:  Procedure Laterality Date   ABDOMINAL HYSTERECTOMY     NASAL SINUS SURGERY     NECK SURGERY     TRIGGER FINGER RELEASE      There were no vitals filed for this visit.    Subjective Assessment - 08/11/20 1622     Subjective On April 27, 2021 she had a fusion of L4-5 and L5-S1. She went to inpatient rehba for 10 days at a center, then had home helath care for about 3 weeks. She has been trying to increase her walking, but hasn't been as disciplined over the past weeks. She went grocery shopping for the first time yesterday and noticed by the end that her legs felt pretty tired and weak. She is driving again but notices she has problems getting in and out of the car (has a low Sedan), and problems with getting on/off low surfaces.    Pertinent History HTN, migraine, neck pain, vertigo, HLD    Limitations Lifting;Walking;House hold activities;Standing    How long can you walk  comfortably? a grocery store trip    Patient Stated Goals strengthen my legs    Currently in Pain? Yes    Pain Score 6     Pain Location Back    Pain Orientation Lower;Mid    Pain Descriptors / Indicators --   'somebodies pushing on it and won't get off'   Pain Type Surgical pain;Chronic pain    Pain Radiating Towards radiates from feet up to knees and around back of the knees bilaterally    Pain Onset More than a month ago    Pain Frequency Constant    Aggravating Factors  in the morning is worst, turning over and getting out of bed, laying on courch (supine to sit)    Pain Relieving Factors gabapentin, heat    Effect of Pain on Daily Activities not sleeping in bed (recliner), tying shoes                  [facility] PT Assessment - 08/12/20 0001       Assessment   Medical Diagnosis L4-S1 TLIF PSF    Referring Provider (PT) Starling Manns, MD    Onset Date/Surgical Date 04/27/20  Next MD Visit Unknow    Prior Therapy cervicalgia and dizziness that ended in 2018 here      Precautions   Precautions Back    Precaution Comments s/p 3.5 months    Required Braces or Orthoses --   none     Restrictions   Weight Bearing Restrictions No      Balance Screen   Has the patient fallen in the past 6 months No    Has the patient had a decrease in activity level because of a fear of falling?  No    Is the patient reluctant to leave their home because of a fear of falling?  No      Home Environment   Living Environment Private residence    [place] Access Level entry    [place] One level    [place] - single point   cane whenever sheneeds     Cognition   Overall Cognitive Status Within Functional Limits for tasks assessed      Observation/Other Assessments-Edema    Edema --   R>L foot edema noted     ROM / Strength   AROM / PROM / Strength Strength;AROM      AROM   Overall AROM Comments lumbar 75% limited in all directions    AROM  Assessment Site Lumbar      Strength   Strength Assessment Site Hip;Knee    Right/Left Hip Right;Left    Right Hip Flexion 3/5    Right Hip Extension 3/5    Right Hip ABduction --   unable to get hip into proper position   Left Hip Flexion 4/5    Left Hip Extension 3/5    Left Hip ABduction --   unable to get hip into proper position   Right Knee Flexion 4+/5    Right Knee Extension 4/5    Left Knee Flexion 3+/5    Left Knee Extension 4+/5      Bed Mobility   Bed Mobility Rolling Right;Rolling Left;Supine to Sit;Sit to Supine;Right Sidelying to Sit    Rolling Right Contact Guard/Touching assist    Rolling Left Contact Guard/Touching assist    Right Sidelying to Sit Moderate Assistance - Patient 50-74%    Supine to Sit Contact Guard/Touching assist    Sit to Supine Contact Guard/Touching assist                                Objective measurements completed on examination: See above findings.          [facility] Adult PT Treatment/Exercise - 08/12/20 0001       Exercises   Exercises Knee/Hip      Knee/Hip Exercises: Stretches   Other Knee/Hip Stretches seated reaching towards toes 2x30"      Knee/Hip Exercises: Seated   Other Seated Knee/Hip Exercises EOB calf/hamstring stretch 2x30" bilaterally    Marching 2 sets;10 reps    Marching Limitations cues for good posture    Sit to Sand with UE support   8 reps; hands on thighs in chair                         PT Education - 08/12/20 0908     Education Details HEP, sx explanation, POC, prognosis, educated to reach out to doctor to see about alternatives for medication as she is experiencing  dizziness with it    Person(s) Educated Patient    Methods Explanation;Demonstration;Tactile cues;Verbal cues;Handout    Comprehension Need further instruction;Returned demonstration;Verbalized understanding              PT Short Term Goals - 08/12/20 1625       PT SHORT TERM GOAL #1   Title Pt will be  independent with initial HEP    Time 2    Period Weeks    Status New    Target Date 08/25/20      PT SHORT TERM GOAL #2   Title PT will go over FOTO results by third visit    Time 2    Period Weeks    Status New    Target Date 08/25/20                PT Long Term Goals - 08/12/20 1626       PT LONG TERM GOAL #1   Title Pt will decrease lumbar AROM limitations to only 25% limited in all motions in order to help with bed mobility and ADLs/IADLs    Time 8    Period Weeks    Status New    Target Date 10/06/20      PT LONG TERM GOAL #2   Title Pt will achieve R hip strength MMTs of 4/5 in order to help with community ambulation and sit to stand transfers from lower levels    Time 8    Period Weeks    Status New    Target Date 10/06/20      PT LONG TERM GOAL #3   Title Pt will increase L hip strengthto 4+/5 in order to help with community ambulation and sit to stand transfers from lower levels    Time 8    Period Weeks    Status New    Target Date 10/06/20      PT LONG TERM GOAL #4   Title Pt will be modified independent with all bed mobility so that she can sleep in her own bed and get onto/off of the couch.    Time 8    Period Weeks    Status New    Target Date 10/06/20      PT LONG TERM GOAL #5   Title Pt will report < 3/10 LBP in order to help increase walking distance and ability to perform prolonged standing needed for IADLs    Time 8    Period Weeks    Status New    Target Date 10/06/20                         Plan - 08/12/20 1607     Clinical Impression Statement Pt is a 75 y/o F presenting to PT s/p April 27, 2020 after a lumbar fusion L4-L5 L5-S1. Examination revealed lumbar AROM limited 75% in all directions, R and L hip strength deficits most significantly in R hip flexion and bilateral hip extension, knee flexion/extension deficits bilaterally, and limitations with bed mobility. Pt needed mod assist to go from right sidelying to sitting  with hand held assist as well as contact guard needed for all other bed mobility. Pt has avoiding BLT movements as she wasn't sure if she could do these yet. Pt would benefit from skilled PT in order increase lumbar ROM, LE strength, bed mobility, functional strength, and transfers in order to be able to sleep in her bed, increase community ambulation  distance, and return to PLOF    Personal Factors and Comorbidities Time since onset of injury/illness/exacerbation;Comorbidity 3+;Age;Fitness    Comorbidities HTN, migraine, neck pain, vertigo, HLD    Examination-Activity Limitations Bathing;Bed Mobility;Bend;Sit;Sleep;Squat;Stairs;Stand;Toileting;Locomotion Level;Transfers;Lift;Caring for Others;Carry;Dressing    Examination-Participation Restrictions Meal Prep;Cleaning;Driving;Yard Work;Volunteer;Laundry    Stability/Clinical Decision Making Evolving/Moderate complexity    Clinical Decision Making Moderate    Rehab Potential Fair    PT Frequency 2x / week    PT Treatment/Interventions ADLs/Self Care Home Management;Aquatic Therapy;Joint Manipulations;Spinal Manipulations;Taping;Passive range of motion;Dry needling;Energy conservation;Manual techniques;Patient/family education;Gait training;Stair training;Functional mobility training;Therapeutic activities;Therapeutic exercise;Balance training;Neuromuscular re-education;Moist Heat;Cryotherapy;Electrical Stimulation    PT Next Visit Plan sensation assessment, AROM assessment hip/knee, sitting/standing strengthening    PT Home Exercise Plan Access Code: [email or web address]    Consulted and Agree with Plan of Care Patient             Patient will benefit from skilled therapeutic intervention in order to improve the following deficits and impairments:  Abnormal gait,Difficulty walking,Pain,Decreased range of motion,Cardiopulmonary status limiting activity,Decreased endurance,Decreased activity tolerance,Impaired sensation,Increased edema,Decreased  strength,Decreased mobility,Postural dysfunction,Improper body mechanics,Hypomobility,Decreased balance,Increased muscle spasms,Dizziness  Visit Diagnosis: Chronic low back pain, unspecified back pain laterality, unspecified whether sciatica present  Muscle weakness (generalized)  Muscle spasm of back  Radiculopathy, lumbar region      Problem List Patient Active Problem List   Diagnosis Date Noted   Dizziness 05/10/2016    Caleb Popp, SPT 08/12/2020, 5:14 PM  [place], [place], [address] Phone: [phone]   Fax:  [phone]  Name: Nicole Herrera MRN: [id number] Date of Birth: 11-Dec-1945   Referring diagnosis? M43.26 Treatment diagnosis? (if different than referring diagnosis) M54.50 What was this (referring dx) caused by? [x]  Surgery []  Fall []  Ongoing issue []  Arthritis []  Other: ____________  Laterality: []  Rt []  Lt [x]  Both  Check all possible CPT codes:      []  97110 (Therapeutic Exercise)  []  92507 (SLP Treatment)  []  97112 (Neuro Re-ed)   []  92526 (Swallowing Treatment)   []  97116 (Gait Training)   []  [id number] (Cognitive Training, 1st 15 minutes) []  97140 (Manual Therapy)   []  97130 (Cognitive Training, each add'l 15 minutes)  []  97530 (Therapeutic Activities)  []  Other, List CPT Code ____________    []  25427 (Self Care)       [x]  All codes above (97110 - 97535)  []  97012 (Mechanical Traction)  [x]  97014 (E-stim Unattended)  []  97032 (E-stim manual)  []  97033 (Ionto)  []  97035 (Ultrasound)  []  97760 (Orthotic Fit) []  [id number] (Physical Performance Training) []  [id number] (Aquatic Therapy) []  97034 (Contrast Bath) []  [id number] (Paraffin) []  97597 (Wound Care 1st 20 sq cm) []  97598 (Wound Care each add'l 20 sq cm) []  97016 (Vasopneumatic Device) []  [id number] Comptroller) []  [id number] (Prosthetic Training)

## 2020-08-12 NOTE — Patient Instructions (Signed)
Access Code: [email or web address] URL: [email or web address] Date: 08/11/2020 Prepared by: Caleb Popp  Exercises  Sit to Stand - 1 x daily - 7 x weekly - 2 sets - 8 reps Seated Calf Stretch with Strap - 1 x daily - 7 x weekly - 3 sets - 10 reps Seated March - 1 x daily - 7 x weekly - 2 sets - 10 reps

## 2020-08-18 ENCOUNTER — Other Ambulatory Visit: Payer: Self-pay

## 2020-08-18 ENCOUNTER — Ambulatory Visit: Payer: Medicare HMO

## 2020-08-18 DIAGNOSIS — M6281 Muscle weakness (generalized): Secondary | ICD-10-CM | POA: Diagnosis not present

## 2020-08-18 DIAGNOSIS — G8929 Other chronic pain: Secondary | ICD-10-CM | POA: Diagnosis not present

## 2020-08-18 DIAGNOSIS — M5416 Radiculopathy, lumbar region: Secondary | ICD-10-CM

## 2020-08-18 DIAGNOSIS — M545 Low back pain, unspecified: Secondary | ICD-10-CM

## 2020-08-18 DIAGNOSIS — M62830 Muscle spasm of back: Secondary | ICD-10-CM

## 2020-08-18 NOTE — Patient Instructions (Signed)

## 2020-08-18 NOTE — Therapy (Signed)
[place], [place], [address] Phone: [phone]   Fax:  [phone]  Physical Therapy Treatment  Patient Details  Name: Nicole Herrera MRN: [id number] Date of Birth: [birth year] Referring Provider (PT): Starling Manns, MD   Encounter Date: 08/18/2020   PT End of Session - 08/18/20 1416    Visit Number 2    Number of Visits 10    Date for PT Re-Evaluation 10/06/20    Authorization Type [employer] MCR    Authorization Time Period 3/22-5/31/22    Authorization - Visit Number 1    Authorization - Number of Visits 12    Progress Note Due on Visit 10    PT Start Time 1336   patient late   PT Stop Time 1416    PT Time Calculation (min) 40 min    Activity Tolerance Patient tolerated treatment well    Behavior During Therapy [facility] for tasks assessed/performed           Past Medical History:  Diagnosis Date  . Acid reflux   . Barrett's esophagus   . Hyperlipemia   . Hypertension   . Migraine   . Neck pain   . Sinusitis   . Tinnitus   . Trigger finger   . Vertigo     Past Surgical History:  Procedure Laterality Date  . ABDOMINAL HYSTERECTOMY    . NASAL SINUS SURGERY    . NECK SURGERY    . TRIGGER FINGER RELEASE      There were no vitals filed for this visit.   Subjective Assessment - 08/18/20 1339    Subjective "I'm hurting more today." She has had to sleep in the recliner the past few days because she was having more pain when sleeping in the bed. Patient reports doing the marching and hamstring stretch as part of HEP, but not the other ones.    Pertinent History HTN, migraine, neck pain, vertigo, HLD    Limitations Lifting;Walking;House hold activities;Standing    How long can you walk comfortably? a grocery store trip    Patient Stated Goals strengthen my legs    Currently in Pain? Yes    Pain Score 7     Pain Location Back    Pain Orientation Lower    Pain Descriptors / Indicators --    compression   Pain Type Surgical pain    Pain Onset More than a month ago              [facility] PT Assessment - 08/18/20 0001      Sensation   Light Touch Appears Intact                         [facility] Adult PT Treatment/Exercise - 08/18/20 0001      Bed Mobility   Rolling Right Independent    Rolling Left Independent    Right Sidelying to Sit Minimal Assistance - Patient > 75%    Supine to Sit Minimal Assistance - Patient > 75%    Sit to Supine Independent      Self-Care   Self-Care Other Self-Care Comments    Other Self-Care Comments  see patient education      Therapeutic Activites    Therapeutic Activities ADL's    ADL's see bed mobility above      Lumbar Exercises: Stretches   Single Knee to Chest Stretch Limitations x 10 bilaterally    Lower Trunk  Rotation 60 seconds    Other Lumbar Stretch Exercise seated calf stretch 30 sec each      Lumbar Exercises: Standing   Other Standing Lumbar Exercises sit to stand 1 x 5      Lumbar Exercises: Seated   Other Seated Lumbar Exercises seated march 2 x 20      Lumbar Exercises: Supine   Clam 10 reps    Clam Limitations x2; green theraband                  PT Education - 08/18/20 1355    Education Details Typical post-operative healing time. Updated HEP. recommendation to discuss post-op restrictions with surgeon at her f/u appointment next week. Bed mobility techniques and recommendation for sleep positions.    Person(s) Educated Patient    Methods Explanation;Demonstration;Verbal cues;Handout    Comprehension Verbalized understanding;Returned demonstration;Verbal cues required            PT Short Term Goals - 08/12/20 1625      PT SHORT TERM GOAL #1   Title Pt will be independent with initial HEP    Time 2    Period Weeks    Status New    Target Date 08/25/20      PT SHORT TERM GOAL #2   Title PT will go over FOTO results by third visit    Time 2    Period Weeks    Status New     Target Date 08/25/20             PT Long Term Goals - 08/12/20 1626      PT LONG TERM GOAL #1   Title Pt will decrease lumbar AROM limitations to only 25% limited in all motions in order to help with bed mobility and ADLs/IADLs    Time 8    Period Weeks    Status New    Target Date 10/06/20      PT LONG TERM GOAL #2   Title Pt will achieve R hip strength MMTs of 4/5 in order to help with community ambulation and sit to stand transfers from lower levels    Time 8    Period Weeks    Status New    Target Date 10/06/20      PT LONG TERM GOAL #3   Title Pt will increase L hip strengthto 4+/5 in order to help with community ambulation and sit to stand transfers from lower levels    Time 8    Period Weeks    Status New    Target Date 10/06/20      PT LONG TERM GOAL #4   Title Pt will be modified independent with all bed mobility so that she can sleep in her own bed and get onto/off of the couch.    Time 8    Period Weeks    Status New    Target Date 10/06/20      PT LONG TERM GOAL #5   Title Pt will report < 3/10 LBP in order to help increase walking distance and ability to perform prolonged standing needed for IADLs    Time 8    Period Weeks    Status New    Target Date 10/06/20                 Plan - 08/18/20 1342    Clinical Impression Statement Time spent educating patient on appropriate techniques for sit <>supine transfer with patient able to complete sit  to supine indendently and required min A to complete supine to sit. Handout provided which details log roll technique and provides recommendations for appropriate sleep positions with recommendation to attempt sleeping in her bed with pillow as support. Reviewed HEP with patient requiring cues for appropriate setup of calf strech and cues for appropriate sequencing of sit to stand as patient demonstrates excessive anterior tibial translation with ability to correct once cued. Overall good tolerance to light  progression of lumbar mobility and hip strengthening without increased pain reported.    Personal Factors and Comorbidities Time since onset of injury/illness/exacerbation;Comorbidity 3+;Age;Fitness    Comorbidities HTN, migraine, neck pain, vertigo, HLD    Examination-Activity Limitations Bathing;Bed Mobility;Bend;Sit;Sleep;Squat;Stairs;Stand;Toileting;Locomotion Level;Transfers;Lift;Caring for Others;Carry;Dressing    Examination-Participation Restrictions Meal Prep;Cleaning;Driving;Yard Work;Volunteer;Laundry    Stability/Clinical Decision Making Evolving/Moderate complexity    Rehab Potential Fair    PT Frequency 2x / week    PT Treatment/Interventions ADLs/Self Care Home Management;Aquatic Therapy;Joint Manipulations;Spinal Manipulations;Taping;Passive range of motion;Dry needling;Energy conservation;Manual techniques;Patient/family education;Gait training;Stair training;Functional mobility training;Therapeutic activities;Therapeutic exercise;Balance training;Neuromuscular re-education;Moist Heat;Cryotherapy;Electrical Stimulation    PT Next Visit Plan AROM assessment hip/knee, sitting/standing strengthening    PT Home Exercise Plan Access Code: [email or web address]    Consulted and Agree with Plan of Care Patient           Patient will benefit from skilled therapeutic intervention in order to improve the following deficits and impairments:  Abnormal gait,Difficulty walking,Pain,Decreased range of motion,Cardiopulmonary status limiting activity,Decreased endurance,Decreased activity tolerance,Impaired sensation,Increased edema,Decreased strength,Decreased mobility,Postural dysfunction,Improper body mechanics,Hypomobility,Decreased balance,Increased muscle spasms,Dizziness  Visit Diagnosis: Chronic low back pain, unspecified back pain laterality, unspecified whether sciatica present  Muscle weakness (generalized)  Muscle spasm of back  Radiculopathy, lumbar region     Problem List Patient  Active Problem List   Diagnosis Date Noted  . Dizziness 05/10/2016   Gwendolyn Grant, PT, DPT, ATC 08/18/20 2:29 PM [facility] [address] [place], [place], [address] Phone: [phone]   Fax:  [phone]  Name: Nicole Herrera MRN: [id number] Date of Birth: 05-21-1946

## 2020-08-19 DIAGNOSIS — M79641 Pain in right hand: Secondary | ICD-10-CM | POA: Diagnosis not present

## 2020-08-19 DIAGNOSIS — M79642 Pain in left hand: Secondary | ICD-10-CM | POA: Diagnosis not present

## 2020-08-25 ENCOUNTER — Other Ambulatory Visit: Payer: Self-pay

## 2020-08-25 ENCOUNTER — Ambulatory Visit: Payer: Medicare HMO | Attending: Orthopaedic Surgery

## 2020-08-25 DIAGNOSIS — M62830 Muscle spasm of back: Secondary | ICD-10-CM | POA: Diagnosis not present

## 2020-08-25 DIAGNOSIS — G8929 Other chronic pain: Secondary | ICD-10-CM | POA: Diagnosis not present

## 2020-08-25 DIAGNOSIS — M6281 Muscle weakness (generalized): Secondary | ICD-10-CM | POA: Insufficient documentation

## 2020-08-25 DIAGNOSIS — M545 Low back pain, unspecified: Secondary | ICD-10-CM | POA: Insufficient documentation

## 2020-08-25 DIAGNOSIS — M5416 Radiculopathy, lumbar region: Secondary | ICD-10-CM | POA: Diagnosis not present

## 2020-08-25 NOTE — Therapy (Signed)
[place], [place], [address] Phone: [phone]   Fax:  [phone]  Physical Therapy Treatment  Patient Details  Name: COVA KNIERIEM MRN: [id number] Date of Birth: [birth year] Referring Provider (PT): Starling Manns, MD   Encounter Date: 08/25/2020   PT End of Session - 08/25/20 1144    Visit Number 3    Number of Visits 10    Date for PT Re-Evaluation 10/06/20    Authorization Type [employer] MCR    Authorization Time Period 3/22-5/31/22    Authorization - Visit Number 2    Authorization - Number of Visits 12    Progress Note Due on Visit 10    PT Start Time 5009    PT Stop Time 1229    PT Time Calculation (min) 44 min    Activity Tolerance Patient tolerated treatment well    Behavior During Therapy [facility] for tasks assessed/performed           Past Medical History:  Diagnosis Date  . Acid reflux   . Barrett's esophagus   . Hyperlipemia   . Hypertension   . Migraine   . Neck pain   . Sinusitis   . Tinnitus   . Trigger finger   . Vertigo     Past Surgical History:  Procedure Laterality Date  . ABDOMINAL HYSTERECTOMY    . NASAL SINUS SURGERY    . NECK SURGERY    . TRIGGER FINGER RELEASE      There were no vitals filed for this visit.   Subjective Assessment - 08/25/20 1146    Subjective "It was feeling pretty good until today. I have started walking more outside my apartment."    Pertinent History HTN, migraine, neck pain, vertigo, HLD    Limitations Lifting;Walking;House hold activities;Standing    How long can you walk comfortably? a grocery store trip    Patient Stated Goals strengthen my legs    Currently in Pain? Yes    Pain Score 5     Pain Location Back    Pain Orientation Lower    Pain Descriptors / Indicators Pressure    Pain Type Surgical pain    Pain Onset More than a month ago                             [facility] Adult PT Treatment/Exercise - 08/25/20  0001      Bed Mobility   Rolling Right Independent    Rolling Left Independent    Supine to Sit Minimal Assistance - Patient > 75%;Supervision/Verbal cueing;Set up assist   initially required Min A to transfer; practice sidelying to sit focusing on proper sequence of movement from inclined height with patient Independent after multiple reps   Sit to Supine Independent      Self-Care   Other Self-Care Comments  see patient education      Therapeutic Activites    Therapeutic Activities ADL's    ADL's see bed mobility above      Lumbar Exercises: Stretches   Lower Trunk Rotation 30 seconds    Figure 4 Stretch 30 seconds    Figure 4 Stretch Limitations bilateral x 2      Lumbar Exercises: Seated   Other Seated Lumbar Exercises seated march 2 x 10 2#; seated trunk rotation with stability ball 2 x 10 partial range    Other Seated Lumbar Exercises hip hinge with dowel 2  x 10      Lumbar Exercises: Supine   Pelvic Tilt 10 reps    Pelvic Tilt Limitations x 2    Bent Knee Raise 20 reps    Bent Knee Raise Limitations x 2    Bridge Limitations attempted pain                  PT Education - 08/25/20 1154    Education Details Reviewed FOTO score and anticipated progress. Review HEP.    Person(s) Educated Patient    Methods Explanation    Comprehension Verbalized understanding            PT Short Term Goals - 08/25/20 1151      PT SHORT TERM GOAL #1   Title Pt will be independent with initial HEP    Time 2    Period Weeks    Status On-going    Target Date 08/25/20      PT SHORT TERM GOAL #2   Title PT will go over FOTO results by third visit    Time 2    Period Weeks    Status Achieved    Target Date 08/25/20             PT Long Term Goals - 08/12/20 1626      PT LONG TERM GOAL #1   Title Pt will decrease lumbar AROM limitations to only 25% limited in all motions in order to help with bed mobility and ADLs/IADLs    Time 8    Period Weeks    Status New     Target Date 10/06/20      PT LONG TERM GOAL #2   Title Pt will achieve R hip strength MMTs of 4/5 in order to help with community ambulation and sit to stand transfers from lower levels    Time 8    Period Weeks    Status New    Target Date 10/06/20      PT LONG TERM GOAL #3   Title Pt will increase L hip strengthto 4+/5 in order to help with community ambulation and sit to stand transfers from lower levels    Time 8    Period Weeks    Status New    Target Date 10/06/20      PT LONG TERM GOAL #4   Title Pt will be modified independent with all bed mobility so that she can sleep in her own bed and get onto/off of the couch.    Time 8    Period Weeks    Status New    Target Date 10/06/20      PT LONG TERM GOAL #5   Title Pt will report < 3/10 LBP in order to help increase walking distance and ability to perform prolonged standing needed for IADLs    Time 8    Period Weeks    Status New    Target Date 10/06/20                 Plan - 08/25/20 1200    Clinical Impression Statement Time spent reviewing prescribed sets and reps of HEP as patient admits to completing excessive repetitions as part of HEP. Overall good tolerance to today's session with exception of hip bridge as patient reports immediate onset of muscle spasm in low back causing exercise to be discontinued. Patient had another muscle spasm when transitioning from supine to sit at beginning of session though this is likely due to  poor body mechanics with this transition. Practiced bed mobility including sidelying to sit transfer from inclined height focusing on appropriate sequencing with patient able to properly perform independently after continued repetitions. Overall good tolerance to progression of strengthening and trunk mobility during today's session.    Personal Factors and Comorbidities Time since onset of injury/illness/exacerbation;Comorbidity 3+;Age;Fitness    Comorbidities HTN, migraine, neck pain,  vertigo, HLD    Examination-Activity Limitations Bathing;Bed Mobility;Bend;Sit;Sleep;Squat;Stairs;Stand;Toileting;Locomotion Level;Transfers;Lift;Caring for Others;Carry;Dressing    Examination-Participation Restrictions Meal Prep;Cleaning;Driving;Yard Work;Volunteer;Laundry    Stability/Clinical Decision Making Evolving/Moderate complexity    Rehab Potential Fair    PT Frequency 2x / week    PT Treatment/Interventions ADLs/Self Care Home Management;Aquatic Therapy;Joint Manipulations;Spinal Manipulations;Taping;Passive range of motion;Dry needling;Energy conservation;Manual techniques;Patient/family education;Gait training;Stair training;Functional mobility training;Therapeutic activities;Therapeutic exercise;Balance training;Neuromuscular re-education;Moist Heat;Cryotherapy;Electrical Stimulation    PT Next Visit Plan Review bed mobility focusing on independent supine to sit transfer. progress trunk mobility and hip/core strength as tolerated.    PT Home Exercise Plan Access Code: [email or web address]    Consulted and Agree with Plan of Care Patient           Patient will benefit from skilled therapeutic intervention in order to improve the following deficits and impairments:  Abnormal gait,Difficulty walking,Pain,Decreased range of motion,Cardiopulmonary status limiting activity,Decreased endurance,Decreased activity tolerance,Impaired sensation,Increased edema,Decreased strength,Decreased mobility,Postural dysfunction,Improper body mechanics,Hypomobility,Decreased balance,Increased muscle spasms,Dizziness  Visit Diagnosis: Chronic low back pain, unspecified back pain laterality, unspecified whether sciatica present  Muscle weakness (generalized)  Muscle spasm of back     Problem List Patient Active Problem List   Diagnosis Date Noted  . Dizziness 05/10/2016   Gwendolyn Grant, PT, DPT, ATC 08/25/20 1:23 PM  [facility] [address] [place], [place], [address] Phone: [phone]   Fax:  [phone]  Name: AZIYAH PROVENCAL MRN: [id number] Date of Birth: 07-03-1945

## 2020-08-27 ENCOUNTER — Encounter: Payer: Self-pay | Admitting: Physical Therapy

## 2020-08-27 ENCOUNTER — Ambulatory Visit: Payer: Medicare HMO | Admitting: Physical Therapy

## 2020-08-27 ENCOUNTER — Other Ambulatory Visit: Payer: Self-pay

## 2020-08-27 DIAGNOSIS — M5416 Radiculopathy, lumbar region: Secondary | ICD-10-CM

## 2020-08-27 DIAGNOSIS — M545 Low back pain, unspecified: Secondary | ICD-10-CM

## 2020-08-27 DIAGNOSIS — G8929 Other chronic pain: Secondary | ICD-10-CM | POA: Diagnosis not present

## 2020-08-27 DIAGNOSIS — M6281 Muscle weakness (generalized): Secondary | ICD-10-CM

## 2020-08-27 DIAGNOSIS — M62830 Muscle spasm of back: Secondary | ICD-10-CM | POA: Diagnosis not present

## 2020-08-27 NOTE — Therapy (Signed)
[place], [place], [address] Phone: [phone]   Fax:  [phone]  Physical Therapy Treatment  Patient Details  Name: Nicole Herrera MRN: [id number] Date of Birth: Nov 05, 1945 Referring Provider (PT): Starling Manns, MD   Encounter Date: 08/27/2020   PT End of Session - 08/27/20 1505    Visit Number 4    Number of Visits 10    Date for PT Re-Evaluation 10/06/20    Authorization Type [employer] MCR    Authorization Time Period 3/22-5/31/22    Authorization - Visit Number 3    Authorization - Number of Visits 12    Progress Note Due on Visit 10    PT Start Time 1517    PT Stop Time 1459    PT Time Calculation (min) 44 min    Activity Tolerance Patient tolerated treatment well    Behavior During Therapy [facility] for tasks assessed/performed           Past Medical History:  Diagnosis Date  . Acid reflux   . Barrett's esophagus   . Hyperlipemia   . Hypertension   . Migraine   . Neck pain   . Sinusitis   . Tinnitus   . Trigger finger   . Vertigo     Past Surgical History:  Procedure Laterality Date  . ABDOMINAL HYSTERECTOMY    . NASAL SINUS SURGERY    . NECK SURGERY    . TRIGGER FINGER RELEASE      There were no vitals filed for this visit.   Subjective Assessment - 08/27/20 1432    Subjective Pt. reports spasm in right lumbar region this PM worse with transfers/bed mobility. She reports difficulty lifting hips to scoot for bed mobility. She has follow up with PA next week for post-op status so discussed check on any current restrictions.    Pertinent History HTN, migraine, neck pain, vertigo, HLD    Currently in Pain? Yes    Pain Score 6     Pain Location Back    Pain Orientation Lower;Right    Pain Descriptors / Indicators Spasm;Sharp    Pain Type Surgical pain;Chronic pain    Pain Onset More than a month ago    Pain Frequency Constant    Aggravating Factors  bed mobility/transfers    Pain  Relieving Factors walking, heat    Effect of Pain on Daily Activities impacts ability and tolerance for bed mobility and transfers              [facility] PT Assessment - 08/27/20 0001      Bed Mobility   Supine to Sit Supervision/Verbal cueing                         [facility] Adult PT Treatment/Exercise - 08/27/20 0001      Bed Mobility   Rolling Right Independent    Rolling Left Independent    Sit to Supine Independent      Lumbar Exercises: Stretches   Single Knee to Chest Stretch Right;Left;3 reps;10 seconds    Lower Trunk Rotation --   1 minute     Lumbar Exercises: Supine   Pelvic Tilt 10 reps    Pelvic Tilt Limitations x 2    Clam 20 reps    Clam Limitations green    Bent Knee Raise 20 reps    Bent Knee Raise Limitations cues forposterior pelvic tilt    Other Supine  Lumbar Exercises hooklying glut set 3-5 sec x 15 reps    Other Supine Lumbar Exercises hip adduction isometric with small ball 3-5 sec x 15 reps      Knee/Hip Exercises: Standing   Forward Step Up Right;Left;1 set;10 reps;Hand Hold: 2;Step Height: 4"      Knee/Hip Exercises: Seated   Sit to Sand 10 reps;without UE support   from edge of high low table     Manual Therapy   Manual Therapy Soft tissue mobilization    Soft tissue mobilization STM right lower lumbar paraspinals in left sidelying   including brief IASTM with tennis ball-instructed on home use tennis ball in bed vs. sitting for trigger point release                 PT Education - 08/27/20 1504    Education Details bed mobility/mechanics for scooting in bed and supine<>sit/logrolling    Person(s) Educated Patient    Methods Explanation;Verbal cues    Comprehension Verbalized understanding;Returned demonstration            PT Short Term Goals - 08/25/20 1151      PT SHORT TERM GOAL #1   Title Pt will be independent with initial HEP    Time 2    Period Weeks    Status On-going    Target Date 08/25/20      PT  SHORT TERM GOAL #2   Title PT will go over FOTO results by third visit    Time 2    Period Weeks    Status Achieved    Target Date 08/25/20             PT Long Term Goals - 08/12/20 1626      PT LONG TERM GOAL #1   Title Pt will decrease lumbar AROM limitations to only 25% limited in all motions in order to help with bed mobility and ADLs/IADLs    Time 8    Period Weeks    Status New    Target Date 10/06/20      PT LONG TERM GOAL #2   Title Pt will achieve R hip strength MMTs of 4/5 in order to help with community ambulation and sit to stand transfers from lower levels    Time 8    Period Weeks    Status New    Target Date 10/06/20      PT LONG TERM GOAL #3   Title Pt will increase L hip strengthto 4+/5 in order to help with community ambulation and sit to stand transfers from lower levels    Time 8    Period Weeks    Status New    Target Date 10/06/20      PT LONG TERM GOAL #4   Title Pt will be modified independent with all bed mobility so that she can sleep in her own bed and get onto/off of the couch.    Time 8    Period Weeks    Status New    Target Date 10/06/20      PT LONG TERM GOAL #5   Title Pt will report < 3/10 LBP in order to help increase walking distance and ability to perform prolonged standing needed for IADLs    Time 8    Period Weeks    Status New    Target Date 10/06/20                 Plan - 08/27/20 1506  Clinical Impression Statement Continued difficulty with bed mobility with difficulty for glut activitation for scooting side to side but with cueing able to perform supine>sit without assistance today. Given limited tolerance bridges worked on Insurance underwriter with sit<>stands and addition step ups which were well-tolerated. Worked on STM to address right-sided lumbar muscle spasm and gave tennis ball for self-STM to help address. Advised also check with MD at follow up next week regarding status of any remaining restrictions and  plan further work on body mechanics for lifting within restrictions once clarified.    Personal Factors and Comorbidities Time since onset of injury/illness/exacerbation;Comorbidity 3+;Age;Fitness    Comorbidities HTN, migraine, neck pain, vertigo, HLD    Examination-Activity Limitations Bathing;Bed Mobility;Bend;Sit;Sleep;Squat;Stairs;Stand;Toileting;Locomotion Level;Transfers;Lift;Caring for Others;Carry;Dressing    Examination-Participation Restrictions Meal Prep;Cleaning;Driving;Yard Work;Volunteer;Laundry    Stability/Clinical Decision Making Evolving/Moderate complexity    Clinical Decision Making Moderate    Rehab Potential Fair    PT Frequency 2x / week    PT Duration 8 weeks    PT Treatment/Interventions ADLs/Self Care Home Management;Aquatic Therapy;Joint Manipulations;Spinal Manipulations;Taping;Passive range of motion;Dry needling;Energy conservation;Manual techniques;Patient/family education;Gait training;Stair training;Functional mobility training;Therapeutic activities;Therapeutic exercise;Balance training;Neuromuscular re-education;Moist Heat;Cryotherapy;Electrical Stimulation    PT Next Visit Plan Review bed mobility focusing on independent supine to sit transfer. progress trunk mobility and hip/core strength as tolerated.    PT Home Exercise Plan Access Code: [email or web address]    Consulted and Agree with Plan of Care Patient           Patient will benefit from skilled therapeutic intervention in order to improve the following deficits and impairments:  Abnormal gait,Difficulty walking,Pain,Decreased range of motion,Cardiopulmonary status limiting activity,Decreased endurance,Decreased activity tolerance,Impaired sensation,Increased edema,Decreased strength,Decreased mobility,Postural dysfunction,Improper body mechanics,Hypomobility,Decreased balance,Increased muscle spasms,Dizziness  Visit Diagnosis: Chronic low back pain, unspecified back pain laterality, unspecified whether sciatica  present  Muscle weakness (generalized)  Muscle spasm of back  Radiculopathy, lumbar region     Problem List Patient Active Problem List   Diagnosis Date Noted  . Dizziness 05/10/2016    Beaulah Dinning, PT, DPT 08/27/20 3:16 PM  [facility] [address] [place], [place], [address] Phone: [phone]   Fax:  [phone]  Name: Nicole Herrera MRN: [id number] Date of Birth: Sep 04, 1945

## 2020-08-28 DIAGNOSIS — I10 Essential (primary) hypertension: Secondary | ICD-10-CM | POA: Diagnosis not present

## 2020-08-28 DIAGNOSIS — K219 Gastro-esophageal reflux disease without esophagitis: Secondary | ICD-10-CM | POA: Diagnosis not present

## 2020-08-28 DIAGNOSIS — E785 Hyperlipidemia, unspecified: Secondary | ICD-10-CM | POA: Diagnosis not present

## 2020-08-31 ENCOUNTER — Ambulatory Visit: Payer: Medicare HMO

## 2020-08-31 ENCOUNTER — Other Ambulatory Visit: Payer: Self-pay

## 2020-08-31 DIAGNOSIS — M6281 Muscle weakness (generalized): Secondary | ICD-10-CM

## 2020-08-31 DIAGNOSIS — M545 Low back pain, unspecified: Secondary | ICD-10-CM | POA: Diagnosis not present

## 2020-08-31 DIAGNOSIS — M62830 Muscle spasm of back: Secondary | ICD-10-CM | POA: Diagnosis not present

## 2020-08-31 DIAGNOSIS — M5416 Radiculopathy, lumbar region: Secondary | ICD-10-CM

## 2020-08-31 DIAGNOSIS — G8929 Other chronic pain: Secondary | ICD-10-CM

## 2020-08-31 NOTE — Therapy (Signed)
[place], [place], [address] Phone: [phone]   Fax:  [phone]  Physical Therapy Treatment  Patient Details  Name: Nicole Herrera MRN: [id number] Date of Birth: 1946/04/29 Referring Provider (PT): Starling Manns, MD   Encounter Date: 08/31/2020   PT End of Session - 08/31/20 1157    Visit Number 5    Number of Visits 10    Date for PT Re-Evaluation 10/06/20    Authorization Type [employer] MCR    Authorization Time Period 3/22-5/31/22    Authorization - Visit Number 4    Authorization - Number of Visits 12    Progress Note Due on Visit 10    PT Start Time 1148    PT Stop Time 1230    PT Time Calculation (min) 42 min    Activity Tolerance Patient tolerated treatment well    Behavior During Therapy [facility] for tasks assessed/performed           Past Medical History:  Diagnosis Date  . Acid reflux   . Barrett's esophagus   . Hyperlipemia   . Hypertension   . Migraine   . Neck pain   . Sinusitis   . Tinnitus   . Trigger finger   . Vertigo     Past Surgical History:  Procedure Laterality Date  . ABDOMINAL HYSTERECTOMY    . NASAL SINUS SURGERY    . NECK SURGERY    . TRIGGER FINGER RELEASE      There were no vitals filed for this visit.   Subjective Assessment - 08/31/20 1150    Subjective "I'm still feeling tight in my lumbar. I was sore mostly on the Rt side after last time. The tennis ball really did help with the tightness."    Pertinent History HTN, migraine, neck pain, vertigo, HLD    Currently in Pain? Yes    Pain Score 4     Pain Location Back    Pain Orientation Lower    Pain Descriptors / Indicators Tightness    Pain Type Surgical pain    Pain Onset More than a month ago                             [facility] Adult PT Treatment/Exercise - 08/31/20 0001      Self-Care   Other Self-Care Comments  see patient education      Lumbar Exercises: Stretches   Other  Lumbar Stretch Exercise stability ball rollout 2 min    Other Lumbar Stretch Exercise QL doorway stretch 30 sec each      Lumbar Exercises: Seated   Other Seated Lumbar Exercises pelvic tilts 2 x 10      Lumbar Exercises: Supine   Bent Knee Raise 10 reps    Bent Knee Raise Limitations x2; with PPT    Straight Leg Raise 10 reps    Straight Leg Raises Limitations x2; bilateral      Lumbar Exercises: Sidelying   Clam 10 reps    Clam Limitations x 2                  PT Education - 08/31/20 1225    Education Details Education on interventions to utilize when spasm occurs including tennis ball for STM, seated pelvic tilts, deep breathing, stretching, heat pack, and walking.    Person(s) Educated Patient    Methods Explanation;Demonstration;Verbal cues    Comprehension Verbalized  understanding;Returned demonstration;Verbal cues required            PT Short Term Goals - 08/25/20 1151      PT SHORT TERM GOAL #1   Title Pt will be independent with initial HEP    Time 2    Period Weeks    Status On-going    Target Date 08/25/20      PT SHORT TERM GOAL #2   Title PT will go over FOTO results by third visit    Time 2    Period Weeks    Status Achieved    Target Date 08/25/20             PT Long Term Goals - 08/12/20 1626      PT LONG TERM GOAL #1   Title Pt will decrease lumbar AROM limitations to only 25% limited in all motions in order to help with bed mobility and ADLs/IADLs    Time 8    Period Weeks    Status New    Target Date 10/06/20      PT LONG TERM GOAL #2   Title Pt will achieve R hip strength MMTs of 4/5 in order to help with community ambulation and sit to stand transfers from lower levels    Time 8    Period Weeks    Status New    Target Date 10/06/20      PT LONG TERM GOAL #3   Title Pt will increase L hip strengthto 4+/5 in order to help with community ambulation and sit to stand transfers from lower levels    Time 8    Period Weeks     Status New    Target Date 10/06/20      PT LONG TERM GOAL #4   Title Pt will be modified independent with all bed mobility so that she can sleep in her own bed and get onto/off of the couch.    Time 8    Period Weeks    Status New    Target Date 10/06/20      PT LONG TERM GOAL #5   Title Pt will report < 3/10 LBP in order to help increase walking distance and ability to perform prolonged standing needed for IADLs    Time 8    Period Weeks    Status New    Target Date 10/06/20                 Plan - 08/31/20 1148    Clinical Impression Statement Patient tolerated session well today with progression of hip/core strengthening and trunk mobility. She is demonstrating improved bed mobility with ability to roll from left <> right with more controlled, fluid movement without need for assistance and was able to transfer from supine to sit independently, though spasm occured in the Rt low back with this transition. Time spent educating patient on interventions to utilize at home if spasms continue including deep breathing, gentle mobility, soft tissue mobilization, and modalities. She quickly fatigues with hip and core strengthening more so on the RLE compared to the LLE, though no reports of back pain during strengthening exercises. She was again recommended to discuss any remaining restriction with MD at appointment later this week.    Personal Factors and Comorbidities Time since onset of injury/illness/exacerbation;Comorbidity 3+;Age;Fitness    Comorbidities HTN, migraine, neck pain, vertigo, HLD    Examination-Activity Limitations Bathing;Bed Mobility;Bend;Sit;Sleep;Squat;Stairs;Stand;Toileting;Locomotion Level;Transfers;Lift;Caring for Others;Carry;Dressing    Examination-Participation Restrictions Meal Prep;Cleaning;Driving;Yard Work;Volunteer;Laundry  Stability/Clinical Decision Making Evolving/Moderate complexity    Rehab Potential Fair    PT Frequency 2x / week    PT Duration 8  weeks    PT Treatment/Interventions ADLs/Self Care Home Management;Aquatic Therapy;Joint Manipulations;Spinal Manipulations;Taping;Passive range of motion;Dry needling;Energy conservation;Manual techniques;Patient/family education;Gait training;Stair training;Functional mobility training;Therapeutic activities;Therapeutic exercise;Balance training;Neuromuscular re-education;Moist Heat;Cryotherapy;Electrical Stimulation    PT Next Visit Plan consider NuStep. progress trunk mobility and hip/core strength as tolerated.    PT Home Exercise Plan Access Code: [email or web address]    Consulted and Agree with Plan of Care Patient           Patient will benefit from skilled therapeutic intervention in order to improve the following deficits and impairments:  Abnormal gait,Difficulty walking,Pain,Decreased range of motion,Cardiopulmonary status limiting activity,Decreased endurance,Decreased activity tolerance,Impaired sensation,Increased edema,Decreased strength,Decreased mobility,Postural dysfunction,Improper body mechanics,Hypomobility,Decreased balance,Increased muscle spasms,Dizziness  Visit Diagnosis: Chronic low back pain, unspecified back pain laterality, unspecified whether sciatica present  Muscle weakness (generalized)  Muscle spasm of back  Radiculopathy, lumbar region     Problem List Patient Active Problem List   Diagnosis Date Noted  . Dizziness 05/10/2016   Gwendolyn Grant, PT, DPT, ATC 08/31/20 12:35 PM  [facility] [address] [place], [place], [address] Phone: [phone]   Fax:  [phone]  Name: Nicole Herrera MRN: [id number] Date of Birth: [birth year]

## 2020-09-03 DIAGNOSIS — M4326 Fusion of spine, lumbar region: Secondary | ICD-10-CM | POA: Diagnosis not present

## 2020-09-03 DIAGNOSIS — Z6836 Body mass index (BMI) 36.0-36.9, adult: Secondary | ICD-10-CM | POA: Diagnosis not present

## 2020-09-03 DIAGNOSIS — M48062 Spinal stenosis, lumbar region with neurogenic claudication: Secondary | ICD-10-CM | POA: Diagnosis not present

## 2020-09-07 ENCOUNTER — Ambulatory Visit: Payer: Medicare HMO

## 2020-09-07 ENCOUNTER — Other Ambulatory Visit: Payer: Self-pay

## 2020-09-07 DIAGNOSIS — M5416 Radiculopathy, lumbar region: Secondary | ICD-10-CM

## 2020-09-07 DIAGNOSIS — M62830 Muscle spasm of back: Secondary | ICD-10-CM

## 2020-09-07 DIAGNOSIS — M545 Low back pain, unspecified: Secondary | ICD-10-CM | POA: Diagnosis not present

## 2020-09-07 DIAGNOSIS — G8929 Other chronic pain: Secondary | ICD-10-CM | POA: Diagnosis not present

## 2020-09-07 DIAGNOSIS — M6281 Muscle weakness (generalized): Secondary | ICD-10-CM | POA: Diagnosis not present

## 2020-09-07 NOTE — Therapy (Signed)
[place], [place], [address] Phone: [phone]   Fax:  [phone]  Physical Therapy Treatment  Patient Details  Name: Nicole Herrera MRN: [id number] Date of Birth: September 08, 1945 Referring Provider (PT): Starling Manns, MD   Encounter Date: 09/07/2020   PT End of Session - 09/07/20 1327    Visit Number 6    Number of Visits 10    Date for PT Re-Evaluation 10/06/20    Authorization Type [employer] MCR    Authorization Time Period 3/22-5/31/22    Authorization - Visit Number 5    Authorization - Number of Visits 12    Progress Note Due on Visit 10    PT Start Time 1332    PT Stop Time 1415    PT Time Calculation (min) 43 min    Activity Tolerance Patient tolerated treatment well    Behavior During Therapy [facility] for tasks assessed/performed           Past Medical History:  Diagnosis Date  . Acid reflux   . Barrett's esophagus   . Hyperlipemia   . Hypertension   . Migraine   . Neck pain   . Sinusitis   . Tinnitus   . Trigger finger   . Vertigo     Past Surgical History:  Procedure Laterality Date  . ABDOMINAL HYSTERECTOMY    . NASAL SINUS SURGERY    . NECK SURGERY    . TRIGGER FINGER RELEASE      There were no vitals filed for this visit.   Subjective Assessment - 09/07/20 1333    Subjective "Spasms keep coming around and the breathing technique is really helping and the stretch on the wall." Patient had f/u with MD and the only thing she can't do is lift 25 lbs.    Pertinent History HTN, migraine, neck pain, vertigo, HLD    Currently in Pain? Yes    Pain Score 4     Pain Location Back    Pain Orientation Lower    Pain Descriptors / Indicators Tightness    Pain Type Surgical pain    Pain Onset More than a month ago              [facility] PT Assessment - 09/07/20 0001      Observation/Other Assessments   Focus on Therapeutic Outcomes (FOTO)  56%                          [facility] Adult PT Treatment/Exercise - 09/07/20 0001      Self-Care   Other Self-Care Comments  see patient education      Lumbar Exercises: Seated   Other Seated Lumbar Exercises stabilty ball rollout 1 min      Lumbar Exercises: Sidelying   Other Sidelying Lumbar Exercises open book 1 x 5 each      Knee/Hip Exercises: Standing   Other Standing Knee Exercises hip hinge with dowel 2 x 10      Knee/Hip Exercises: Seated   Other Seated Knee/Hip Exercises hip hinge with dowel 1  x10    Sit to Sand --   2 x 8                 PT Education - 09/07/20 1344    Education Details education on POC, FOTO score. proper body mechanics/posture.    Person(s) Educated Patient    Methods Explanation    Comprehension Verbalized  understanding            PT Short Term Goals - 09/07/20 1349      PT SHORT TERM GOAL #1   Title Pt will be independent with initial HEP    Baseline independent, though not consistent    Time 2    Period Weeks    Status Achieved    Target Date 08/25/20      PT SHORT TERM GOAL #2   Title PT will go over FOTO results by third visit    Time 2    Period Weeks    Status Achieved    Target Date 08/25/20             PT Long Term Goals - 08/12/20 1626      PT LONG TERM GOAL #1   Title Pt will decrease lumbar AROM limitations to only 25% limited in all motions in order to help with bed mobility and ADLs/IADLs    Time 8    Period Weeks    Status New    Target Date 10/06/20      PT LONG TERM GOAL #2   Title Pt will achieve R hip strength MMTs of 4/5 in order to help with community ambulation and sit to stand transfers from lower levels    Time 8    Period Weeks    Status New    Target Date 10/06/20      PT LONG TERM GOAL #3   Title Pt will increase L hip strengthto 4+/5 in order to help with community ambulation and sit to stand transfers from lower levels    Time 8    Period Weeks    Status New    Target Date  10/06/20      PT LONG TERM GOAL #4   Title Pt will be modified independent with all bed mobility so that she can sleep in her own bed and get onto/off of the couch.    Time 8    Period Weeks    Status New    Target Date 10/06/20      PT LONG TERM GOAL #5   Title Pt will report < 3/10 LBP in order to help increase walking distance and ability to perform prolonged standing needed for IADLs    Time 8    Period Weeks    Status New    Target Date 10/06/20                 Plan - 09/07/20 1345    Clinical Impression Statement Patient reports overall improvements in her pain and finds that stretching and diaphragmatic breathing have been helpful with her muscle spasms. She had recent f/u with MD and her only restriction in place is no lifting over 25 lbs. Time spent educating patient on proper bending/lifting mechanics focusing on hip hinge in standing with patient able to properly perform through short ROM. Handout provided regarding appropriate posture and body mechanics with bending/lifting with recommendation to utilize golfer's lift when needed. One back spasm during session when patient transferred from supine to sit that subsided with stretching.    Personal Factors and Comorbidities Time since onset of injury/illness/exacerbation;Comorbidity 3+;Age;Fitness    Comorbidities HTN, migraine, neck pain, vertigo, HLD    Examination-Activity Limitations Bathing;Bed Mobility;Bend;Sit;Sleep;Squat;Stairs;Stand;Toileting;Locomotion Level;Transfers;Lift;Caring for Others;Carry;Dressing    Examination-Participation Restrictions Meal Prep;Cleaning;Driving;Yard Work;Volunteer;Laundry    Stability/Clinical Decision Making Evolving/Moderate complexity    Rehab Potential Fair    PT Frequency 2x / week  PT Duration 8 weeks    PT Treatment/Interventions ADLs/Self Care Home Management;Aquatic Therapy;Joint Manipulations;Spinal Manipulations;Taping;Passive range of motion;Dry needling;Energy  conservation;Manual techniques;Patient/family education;Gait training;Stair training;Functional mobility training;Therapeutic activities;Therapeutic exercise;Balance training;Neuromuscular re-education;Moist Heat;Cryotherapy;Electrical Stimulation    PT Next Visit Plan consider NuStep. progress trunk mobility and hip/core strength as tolerated.    PT Home Exercise Plan Access Code: [email or web address]    Consulted and Agree with Plan of Care Patient           Patient will benefit from skilled therapeutic intervention in order to improve the following deficits and impairments:  Abnormal gait,Difficulty walking,Pain,Decreased range of motion,Cardiopulmonary status limiting activity,Decreased endurance,Decreased activity tolerance,Impaired sensation,Increased edema,Decreased strength,Decreased mobility,Postural dysfunction,Improper body mechanics,Hypomobility,Decreased balance,Increased muscle spasms,Dizziness  Visit Diagnosis: Chronic low back pain, unspecified back pain laterality, unspecified whether sciatica present  Muscle weakness (generalized)  Muscle spasm of back  Radiculopathy, lumbar region     Problem List Patient Active Problem List   Diagnosis Date Noted  . Dizziness 05/10/2016   Gwendolyn Grant, PT, DPT, ATC 09/07/20 2:26 PM  [facility] [address] [place], [place], [address] Phone: [phone]   Fax:  [phone]  Name: Nicole Herrera MRN: [id number] Date of Birth: 01-29-1946

## 2020-09-07 NOTE — Patient Instructions (Signed)

## 2020-09-14 ENCOUNTER — Other Ambulatory Visit: Payer: Self-pay

## 2020-09-14 ENCOUNTER — Ambulatory Visit: Payer: Medicare HMO

## 2020-09-14 DIAGNOSIS — M62830 Muscle spasm of back: Secondary | ICD-10-CM

## 2020-09-14 DIAGNOSIS — M545 Low back pain, unspecified: Secondary | ICD-10-CM

## 2020-09-14 DIAGNOSIS — G8929 Other chronic pain: Secondary | ICD-10-CM

## 2020-09-14 DIAGNOSIS — M5416 Radiculopathy, lumbar region: Secondary | ICD-10-CM | POA: Diagnosis not present

## 2020-09-14 DIAGNOSIS — M6281 Muscle weakness (generalized): Secondary | ICD-10-CM | POA: Diagnosis not present

## 2020-09-14 NOTE — Therapy (Signed)
[place], [place], [address] Phone: [phone]   Fax:  [phone]  Physical Therapy Treatment  Patient Details  Name: SAMAI COREA MRN: [id number] Date of Birth: 08/02/1945 Referring Provider (PT): Starling Manns, MD   Encounter Date: 09/14/2020   PT End of Session - 09/14/20 1333    Visit Number 7    Number of Visits 10    Date for PT Re-Evaluation 10/06/20    Authorization Type [employer] MCR    Authorization Time Period 3/22-5/31/22    Authorization - Visit Number 6    Authorization - Number of Visits 12    Progress Note Due on Visit 10    PT Start Time 1332    PT Stop Time 1414    PT Time Calculation (min) 42 min    Activity Tolerance Patient tolerated treatment well    Behavior During Therapy [facility] for tasks assessed/performed           Past Medical History:  Diagnosis Date  . Acid reflux   . Barrett's esophagus   . Hyperlipemia   . Hypertension   . Migraine   . Neck pain   . Sinusitis   . Tinnitus   . Trigger finger   . Vertigo     Past Surgical History:  Procedure Laterality Date  . ABDOMINAL HYSTERECTOMY    . NASAL SINUS SURGERY    . NECK SURGERY    . TRIGGER FINGER RELEASE      There were no vitals filed for this visit.   Subjective Assessment - 09/14/20 1336    Subjective "I'm starting to move better and have less pain."    Pertinent History HTN, migraine, neck pain, vertigo, HLD    Currently in Pain? Yes    Pain Score 3     Pain Location Back    Pain Orientation Lower;Right    Pain Descriptors / Indicators Tightness    Pain Type Surgical pain    Pain Onset More than a month ago              [facility] PT Assessment - 09/14/20 0001      AROM   Lumbar Flexion fingertips to inferior pole of patella    Lumbar Extension WNL    Lumbar - Right Side Bend 50% limitation    Lumbar - Left Side Bend 50% limitation    Lumbar - Right Rotation 25% limitation    Lumbar - Left  Rotation 25% limitation                         [facility] Adult PT Treatment/Exercise - 09/14/20 0001      Lumbar Exercises: Stretches   Other Lumbar Stretch Exercise stability ball rollout 2 min      Lumbar Exercises: Aerobic   Nustep level 5; LE only 5 minutes      Lumbar Exercises: Standing   Heel Raises 15 reps    Heel Raises Limitations x 2      Lumbar Exercises: Seated   Other Seated Lumbar Exercises trunk flexion 1 x 10; 10 sec hold      Lumbar Exercises: Sidelying   Other Sidelying Lumbar Exercises open book 1 x 5 each      Knee/Hip Exercises: Standing   Hip Abduction 10 reps    Abduction Limitations x2    Hip Extension 10 reps    Extension Limitations x 2    Other  Standing Knee Exercises standing march 20 reps                  PT Education - 09/14/20 1358    Education Details updated HEP.    Person(s) Educated Patient    Methods Explanation;Demonstration;Verbal cues;Handout    Comprehension Verbalized understanding;Returned demonstration;Verbal cues required            PT Short Term Goals - 09/07/20 1349      PT SHORT TERM GOAL #1   Title Pt will be independent with initial HEP    Baseline independent, though not consistent    Time 2    Period Weeks    Status Achieved    Target Date 08/25/20      PT SHORT TERM GOAL #2   Title PT will go over FOTO results by third visit    Time 2    Period Weeks    Status Achieved    Target Date 08/25/20             PT Long Term Goals - 09/14/20 1402      PT LONG TERM GOAL #1   Title Pt will decrease lumbar AROM limitations to only 25% limited in all motions in order to help with bed mobility and ADLs/IADLs    Time 8    Period Weeks    Status On-going      PT LONG TERM GOAL #2   Title Pt will achieve R hip strength MMTs of 4/5 in order to help with community ambulation and sit to stand transfers from lower levels    Time 8    Period Weeks    Status Unable to assess      PT LONG  TERM GOAL #3   Title Pt will increase L hip strengthto 4+/5 in order to help with community ambulation and sit to stand transfers from lower levels    Time 8    Period Weeks    Status Unable to assess      PT LONG TERM GOAL #4   Title Pt will be modified independent with all bed mobility so that she can sleep in her own bed and get onto/off of the couch.    Time 8    Period Weeks    Status Achieved      PT LONG TERM GOAL #5   Title Pt will report < 3/10 LBP in order to help increase walking distance and ability to perform prolonged standing needed for IADLs    Time 8    Period Weeks    Status On-going                 Plan - 09/14/20 1343    Clinical Impression Statement Patient tolerated session well today with progression of trunk mobility and hip strengthening. Her supine <> sit transfers have much improved with patient able to perform independently without reports of pain having met this long term functional goal. Her trunk ROM has improved compared to baseline with limitations remaining into flexion and lateral flexion, though denies any increased pain with ROM. HEP updated to include further trunk mobility and hip strengthening exercises. She tolerated ther ex well without increased pain reported, though quickly fatigues with standing hip strengthening.    Personal Factors and Comorbidities Time since onset of injury/illness/exacerbation;Comorbidity 3+;Age;Fitness    Comorbidities HTN, migraine, neck pain, vertigo, HLD    Examination-Activity Limitations Bathing;Bed Mobility;Bend;Sit;Sleep;Squat;Stairs;Stand;Toileting;Locomotion Level;Transfers;Lift;Caring for Others;Carry;Dressing    Examination-Participation Restrictions Meal Prep;Cleaning;Driving;Yard Work;Volunteer;Laundry  Stability/Clinical Decision Making Evolving/Moderate complexity    Rehab Potential Fair    PT Frequency 2x / week    PT Duration 8 weeks    PT Treatment/Interventions ADLs/Self Care Home  Management;Aquatic Therapy;Joint Manipulations;Spinal Manipulations;Taping;Passive range of motion;Dry needling;Energy conservation;Manual techniques;Patient/family education;Gait training;Stair training;Functional mobility training;Therapeutic activities;Therapeutic exercise;Balance training;Neuromuscular re-education;Moist Heat;Cryotherapy;Electrical Stimulation    PT Next Visit Plan progress trunk mobility and hip/core strength as tolerated.    PT Home Exercise Plan Access Code: [email or web address]    Consulted and Agree with Plan of Care Patient           Patient will benefit from skilled therapeutic intervention in order to improve the following deficits and impairments:  Abnormal gait,Difficulty walking,Pain,Decreased range of motion,Cardiopulmonary status limiting activity,Decreased endurance,Decreased activity tolerance,Impaired sensation,Increased edema,Decreased strength,Decreased mobility,Postural dysfunction,Improper body mechanics,Hypomobility,Decreased balance,Increased muscle spasms,Dizziness  Visit Diagnosis: Chronic low back pain, unspecified back pain laterality, unspecified whether sciatica present  Muscle weakness (generalized)  Muscle spasm of back  Radiculopathy, lumbar region     Problem List Patient Active Problem List   Diagnosis Date Noted  . Dizziness 05/10/2016   Gwendolyn Grant, PT, DPT, ATC 09/14/20 2:15 PM  [facility] [address] [place], [place], [address] Phone: [phone]   Fax:  [phone]  Name: BATYA CITRON MRN: [id number] Date of Birth: 1945-09-21

## 2020-09-16 DIAGNOSIS — K219 Gastro-esophageal reflux disease without esophagitis: Secondary | ICD-10-CM | POA: Diagnosis not present

## 2020-09-16 DIAGNOSIS — E785 Hyperlipidemia, unspecified: Secondary | ICD-10-CM | POA: Diagnosis not present

## 2020-09-16 DIAGNOSIS — I10 Essential (primary) hypertension: Secondary | ICD-10-CM | POA: Diagnosis not present

## 2020-09-16 DIAGNOSIS — Z981 Arthrodesis status: Secondary | ICD-10-CM | POA: Diagnosis not present

## 2020-09-17 ENCOUNTER — Ambulatory Visit: Payer: Medicare HMO

## 2020-09-17 ENCOUNTER — Other Ambulatory Visit: Payer: Self-pay

## 2020-09-17 DIAGNOSIS — G8929 Other chronic pain: Secondary | ICD-10-CM

## 2020-09-17 DIAGNOSIS — M6281 Muscle weakness (generalized): Secondary | ICD-10-CM | POA: Diagnosis not present

## 2020-09-17 DIAGNOSIS — M5416 Radiculopathy, lumbar region: Secondary | ICD-10-CM

## 2020-09-17 DIAGNOSIS — M62830 Muscle spasm of back: Secondary | ICD-10-CM | POA: Diagnosis not present

## 2020-09-17 DIAGNOSIS — M545 Low back pain, unspecified: Secondary | ICD-10-CM | POA: Diagnosis not present

## 2020-09-17 NOTE — Therapy (Signed)
[place], [place], [address] Phone: [phone]   Fax:  [phone]  Physical Therapy Treatment  Patient Details  Name: Nicole Herrera MRN: [id number] Date of Birth: May 13, 1946 Referring Provider (PT): Starling Manns, MD   Encounter Date: 09/17/2020   PT End of Session - 09/17/20 1108    Visit Number 8    Number of Visits 10    Date for PT Re-Evaluation 10/06/20    Authorization Type [employer] MCR    Authorization Time Period 3/22-5/31/22    Authorization - Visit Number 7    Authorization - Number of Visits 12    Progress Note Due on Visit 10    PT Start Time 1108   patient late   PT Stop Time 1145    PT Time Calculation (min) 37 min    Activity Tolerance Patient tolerated treatment well    Behavior During Therapy [facility] for tasks assessed/performed           Past Medical History:  Diagnosis Date  . Acid reflux   . Barrett's esophagus   . Hyperlipemia   . Hypertension   . Migraine   . Neck pain   . Sinusitis   . Tinnitus   . Trigger finger   . Vertigo     Past Surgical History:  Procedure Laterality Date  . ABDOMINAL HYSTERECTOMY    . NASAL SINUS SURGERY    . NECK SURGERY    . TRIGGER FINGER RELEASE      There were no vitals filed for this visit.   Subjective Assessment - 09/17/20 1111    Subjective Patient has questions regarding HEP. Denies any pain currently.    Pertinent History HTN, migraine, neck pain, vertigo, HLD    Currently in Pain? No/denies    Pain Onset More than a month ago                             [facility] Adult PT Treatment/Exercise - 09/17/20 0001      Lumbar Exercises: Supine   Straight Leg Raise 10 reps    Straight Leg Raises Limitations x2; bilateral      Lumbar Exercises: Sidelying   Hip Abduction 10 reps    Hip Abduction Limitations x2 bilateral    Other Sidelying Lumbar Exercises open book 1 x 5 each      Knee/Hip Exercises: Standing    Hip Abduction 10 reps    Abduction Limitations x2    Hip Extension 10 reps    Extension Limitations x2    Functional Squat 10 reps    Functional Squat Limitations x2; with UE support      Knee/Hip Exercises: Seated   Sit to Sand 10 reps   attempted with 5lb KB unable due to LOB                   PT Short Term Goals - 09/07/20 1349      PT SHORT TERM GOAL #1   Title Pt will be independent with initial HEP    Baseline independent, though not consistent    Time 2    Period Weeks    Status Achieved    Target Date 08/25/20      PT SHORT TERM GOAL #2   Title PT will go over FOTO results by third visit    Time 2    Period Weeks    Status  Achieved    Target Date 08/25/20             PT Long Term Goals - 09/14/20 1402      PT LONG TERM GOAL #1   Title Pt will decrease lumbar AROM limitations to only 25% limited in all motions in order to help with bed mobility and ADLs/IADLs    Time 8    Period Weeks    Status On-going      PT LONG TERM GOAL #2   Title Pt will achieve R hip strength MMTs of 4/5 in order to help with community ambulation and sit to stand transfers from lower levels    Time 8    Period Weeks    Status Unable to assess      PT LONG TERM GOAL #3   Title Pt will increase L hip strengthto 4+/5 in order to help with community ambulation and sit to stand transfers from lower levels    Time 8    Period Weeks    Status Unable to assess      PT LONG TERM GOAL #4   Title Pt will be modified independent with all bed mobility so that she can sleep in her own bed and get onto/off of the couch.    Time 8    Period Weeks    Status Achieved      PT LONG TERM GOAL #5   Title Pt will report < 3/10 LBP in order to help increase walking distance and ability to perform prolonged standing needed for IADLs    Time 8    Period Weeks    Status On-going                 Plan - 09/17/20 1116    Clinical Impression Statement Patient had questions  regarding sidelying open book and standing hip extension exercises that were prescribed as part of her HEP. Patient required minimal cues for setup with sidelying open book with ability to properly perform after initial cueing. Patient initially reports Rt side low back pain with standing hip extension on the Rt, though when instructed to decrease range of kick and maintain neutral spine, no pain reported. Overall good tolerance to today's session with ability to progress hip strengthening and CKC activity. Attempted adding weight to sit to stand, though patient has LOB when transitioning from sit to stand utilizing kettlebell, so discontinued use of weight with patient able to properly perform without LOB.    Personal Factors and Comorbidities Time since onset of injury/illness/exacerbation;Comorbidity 3+;Age;Fitness    Comorbidities HTN, migraine, neck pain, vertigo, HLD    Examination-Activity Limitations Bathing;Bed Mobility;Bend;Sit;Sleep;Squat;Stairs;Stand;Toileting;Locomotion Level;Transfers;Lift;Caring for Others;Carry;Dressing    Examination-Participation Restrictions Meal Prep;Cleaning;Driving;Yard Work;Volunteer;Laundry    Stability/Clinical Decision Making Evolving/Moderate complexity    Rehab Potential Fair    PT Frequency 2x / week    PT Duration 8 weeks    PT Treatment/Interventions ADLs/Self Care Home Management;Aquatic Therapy;Joint Manipulations;Spinal Manipulations;Taping;Passive range of motion;Dry needling;Energy conservation;Manual techniques;Patient/family education;Gait training;Stair training;Functional mobility training;Therapeutic activities;Therapeutic exercise;Balance training;Neuromuscular re-education;Moist Heat;Cryotherapy;Electrical Stimulation    PT Next Visit Plan progress trunk mobility and hip/core strength as tolerated.    PT Home Exercise Plan Access Code: [email or web address]    Consulted and Agree with Plan of Care Patient           Patient will benefit from skilled  therapeutic intervention in order to improve the following deficits and impairments:  Abnormal gait,Difficulty walking,Pain,Decreased range of motion,Cardiopulmonary status limiting activity,Decreased endurance,Decreased activity  tolerance,Impaired sensation,Increased edema,Decreased strength,Decreased mobility,Postural dysfunction,Improper body mechanics,Hypomobility,Decreased balance,Increased muscle spasms,Dizziness  Visit Diagnosis: Chronic low back pain, unspecified back pain laterality, unspecified whether sciatica present  Muscle weakness (generalized)  Muscle spasm of back  Radiculopathy, lumbar region     Problem List Patient Active Problem List   Diagnosis Date Noted  . Dizziness 05/10/2016   Gwendolyn Grant, PT, DPT, ATC 09/17/20 12:21 PM  [facility] [address] [place], [place], [address] Phone: [phone]   Fax:  [phone]  Name: CHERYN LUNDQUIST MRN: [id number] Date of Birth: 01-06-1946

## 2020-09-22 ENCOUNTER — Ambulatory Visit: Payer: Medicare HMO | Attending: Family Medicine

## 2020-09-22 ENCOUNTER — Other Ambulatory Visit: Payer: Self-pay

## 2020-09-22 DIAGNOSIS — M5416 Radiculopathy, lumbar region: Secondary | ICD-10-CM | POA: Insufficient documentation

## 2020-09-22 DIAGNOSIS — M545 Low back pain, unspecified: Secondary | ICD-10-CM | POA: Insufficient documentation

## 2020-09-22 DIAGNOSIS — G8929 Other chronic pain: Secondary | ICD-10-CM | POA: Insufficient documentation

## 2020-09-22 DIAGNOSIS — M62830 Muscle spasm of back: Secondary | ICD-10-CM | POA: Diagnosis not present

## 2020-09-22 DIAGNOSIS — M6281 Muscle weakness (generalized): Secondary | ICD-10-CM | POA: Diagnosis not present

## 2020-09-22 NOTE — Therapy (Signed)
[place], [place], [address] Phone: [phone]   Fax:  [phone]  Physical Therapy Treatment  Patient Details  Name: Nicole Herrera MRN: [id number] Date of Birth: 10-24-1945 Referring Provider (PT): Starling Manns, MD   Encounter Date: 09/22/2020   PT End of Session - 09/22/20 1107    Visit Number 9    Number of Visits 10    Date for PT Re-Evaluation 10/06/20    Authorization Type [employer] MCR    Authorization Time Period 3/22-5/31/22    Authorization - Visit Number 8    Authorization - Number of Visits 12    Progress Note Due on Visit 10    PT Start Time 1102    PT Stop Time 1145    PT Time Calculation (min) 43 min    Activity Tolerance Patient tolerated treatment well    Behavior During Therapy [facility] for tasks assessed/performed           Past Medical History:  Diagnosis Date  . Acid reflux   . Barrett's esophagus   . Hyperlipemia   . Hypertension   . Migraine   . Neck pain   . Sinusitis   . Tinnitus   . Trigger finger   . Vertigo     Past Surgical History:  Procedure Laterality Date  . ABDOMINAL HYSTERECTOMY    . NASAL SINUS SURGERY    . NECK SURGERY    . TRIGGER FINGER RELEASE      There were no vitals filed for this visit.   Subjective Assessment - 09/22/20 1105    Subjective She reports having a couple episodes of "compression pain" since last session noticing it first the morning after last session.She worked at [employer] event this past weekend where she was standing for over 2 hours and noticed some pain after this. She denies any pain currently.    Pertinent History HTN, migraine, neck pain, vertigo, HLD    Currently in Pain? No/denies    Pain Onset More than a month ago                             [facility] Adult PT Treatment/Exercise - 09/22/20 0001      Lumbar Exercises: Stretches   Single Knee to Chest Stretch 5 reps    Single Knee to Chest Stretch  Limitations bilateral    Lower Trunk Rotation 60 seconds    Figure 4 Stretch 60 seconds    Figure 4 Stretch Limitations supine bilateral    Other Lumbar Stretch Exercise 3 way stability ball rollout 2 min      Lumbar Exercises: Aerobic   Nustep level 5 UE/LE      Knee/Hip Exercises: Seated   Hamstring Curl 10 reps    Hamstring Limitations green band; bilateral    Sit to [employer] 10 reps   x2; while holding ball     Knee/Hip Exercises: Prone   Hip Extension 10 reps    Hip Extension Limitations x2 bilateral                    PT Short Term Goals - 09/07/20 1349      PT SHORT TERM GOAL #1   Title Pt will be independent with initial HEP    Baseline independent, though not consistent    Time 2    Period Weeks    Status Achieved  Target Date 08/25/20      PT SHORT TERM GOAL #2   Title PT will go over FOTO results by third visit    Time 2    Period Weeks    Status Achieved    Target Date 08/25/20             PT Long Term Goals - 09/14/20 1402      PT LONG TERM GOAL #1   Title Pt will decrease lumbar AROM limitations to only 25% limited in all motions in order to help with bed mobility and ADLs/IADLs    Time 8    Period Weeks    Status On-going      PT LONG TERM GOAL #2   Title Pt will achieve R hip strength MMTs of 4/5 in order to help with community ambulation and sit to stand transfers from lower levels    Time 8    Period Weeks    Status Unable to assess      PT LONG TERM GOAL #3   Title Pt will increase L hip strengthto 4+/5 in order to help with community ambulation and sit to stand transfers from lower levels    Time 8    Period Weeks    Status Unable to assess      PT LONG TERM GOAL #4   Title Pt will be modified independent with all bed mobility so that she can sleep in her own bed and get onto/off of the couch.    Time 8    Period Weeks    Status Achieved      PT LONG TERM GOAL #5   Title Pt will report < 3/10 LBP in order to help  increase walking distance and ability to perform prolonged standing needed for IADLs    Time 8    Period Weeks    Status On-going                 Plan - 09/22/20 1109    Clinical Impression Statement Patient tolerated session well today without reports of pain. She demonstrates excessive anterior tibial translation when performing sit<>stand and reports occasional feeling of "being pulled backwards" with 1 episode of LOB when performing exercise. Focused on proper gluteal activation in prone with patient having significant difficulty firing glute max bilaterally.    Personal Factors and Comorbidities Time since onset of injury/illness/exacerbation;Comorbidity 3+;Age;Fitness    Comorbidities HTN, migraine, neck pain, vertigo, HLD    Examination-Activity Limitations Bathing;Bed Mobility;Bend;Sit;Sleep;Squat;Stairs;Stand;Toileting;Locomotion Level;Transfers;Lift;Caring for Others;Carry;Dressing    Examination-Participation Restrictions Meal Prep;Cleaning;Driving;Yard Work;Volunteer;Laundry    Stability/Clinical Decision Making Evolving/Moderate complexity    Rehab Potential Fair    PT Frequency 2x / week    PT Duration 8 weeks    PT Treatment/Interventions ADLs/Self Care Home Management;Aquatic Therapy;Joint Manipulations;Spinal Manipulations;Taping;Passive range of motion;Dry needling;Energy conservation;Manual techniques;Patient/family education;Gait training;Stair training;Functional mobility training;Therapeutic activities;Therapeutic exercise;Balance training;Neuromuscular re-education;Moist Heat;Cryotherapy;Electrical Stimulation    PT Next Visit Plan Re-evaluation. Update HEP. progress trunk mobility and hip/core strength as tolerated.    PT Home Exercise Plan Access Code: [email or web address]    Consulted and Agree with Plan of Care Patient           Patient will benefit from skilled therapeutic intervention in order to improve the following deficits and impairments:  Abnormal  gait,Difficulty walking,Pain,Decreased range of motion,Cardiopulmonary status limiting activity,Decreased endurance,Decreased activity tolerance,Impaired sensation,Increased edema,Decreased strength,Decreased mobility,Postural dysfunction,Improper body mechanics,Hypomobility,Decreased balance,Increased muscle spasms,Dizziness  Visit Diagnosis: Chronic low back pain, unspecified back pain laterality, unspecified  whether sciatica present  Muscle weakness (generalized)  Muscle spasm of back  Radiculopathy, lumbar region     Problem List Patient Active Problem List   Diagnosis Date Noted  . Dizziness 05/10/2016   Gwendolyn Grant, PT, DPT, ATC 09/22/20 11:49 AM  [facility] [address] [place], [place], [address] Phone: [phone]   Fax:  [phone]  Name: Nicole Herrera MRN: [id number] Date of Birth: Nov 28, 1945

## 2020-09-23 DIAGNOSIS — M654 Radial styloid tenosynovitis [de Quervain]: Secondary | ICD-10-CM | POA: Diagnosis not present

## 2020-09-23 DIAGNOSIS — M79645 Pain in left finger(s): Secondary | ICD-10-CM | POA: Diagnosis not present

## 2020-09-25 ENCOUNTER — Encounter: Payer: Self-pay | Admitting: Physical Therapy

## 2020-09-25 ENCOUNTER — Ambulatory Visit: Payer: Medicare HMO | Admitting: Physical Therapy

## 2020-09-25 ENCOUNTER — Other Ambulatory Visit: Payer: Self-pay

## 2020-09-25 DIAGNOSIS — G8929 Other chronic pain: Secondary | ICD-10-CM | POA: Diagnosis not present

## 2020-09-25 DIAGNOSIS — M545 Low back pain, unspecified: Secondary | ICD-10-CM

## 2020-09-25 DIAGNOSIS — M62830 Muscle spasm of back: Secondary | ICD-10-CM | POA: Diagnosis not present

## 2020-09-25 DIAGNOSIS — M5416 Radiculopathy, lumbar region: Secondary | ICD-10-CM

## 2020-09-25 DIAGNOSIS — M6281 Muscle weakness (generalized): Secondary | ICD-10-CM

## 2020-09-25 NOTE — Therapy (Signed)
[place], [place], [address] Phone: [phone]   Fax:  [phone]  Physical Therapy Treatment / ERO  Patient Details  Name: Nicole Herrera MRN: [id number] Date of Birth: 10-18-1945 Referring Provider (PT): Starling Manns, MD   Encounter Date: 09/25/2020   PT End of Session - 09/25/20 1052    Visit Number 10    Number of Visits 12    Date for PT Re-Evaluation 10/09/20    Authorization Type [employer] MCR    Authorization Time Period 3/22-5/31/22    Authorization - Visit Number 9    Authorization - Number of Visits 12    Progress Note Due on Visit 20    PT Start Time 1045    PT Stop Time 1130    PT Time Calculation (min) 45 min    Activity Tolerance Patient tolerated treatment well    Behavior During Therapy [facility] for tasks assessed/performed           Past Medical History:  Diagnosis Date  . Acid reflux   . Barrett's esophagus   . Hyperlipemia   . Hypertension   . Migraine   . Neck pain   . Sinusitis   . Tinnitus   . Trigger finger   . Vertigo     Past Surgical History:  Procedure Laterality Date  . ABDOMINAL HYSTERECTOMY    . NASAL SINUS SURGERY    . NECK SURGERY    . TRIGGER FINGER RELEASE      There were no vitals filed for this visit.   Subjective Assessment - 09/25/20 1049    Subjective Patient report she is doing well, she continues to improve. She did some exercises this morning and she feels these help her feel better. States she continues to feel compression in midline lower back like someone has their fist in her lower back. Also notes right leg is pretty tight still.    Patient Stated Goals strengthen my legs    Currently in Pain? No/denies              [facility] PT Assessment - 09/25/20 0001      Assessment   Medical Diagnosis L4-S1 TLIF PSF    Referring Provider (PT) Starling Manns, MD    Onset Date/Surgical Date 04/27/20      Precautions   Precautions None       Restrictions   Weight Bearing Restrictions No      Observation/Other Assessments   Focus on Therapeutic Outcomes (FOTO)  54%      AROM   Overall AROM Comments Lumbar AROM grossly WFL, tightness sidebend to left, hamstring tightness with forward bend (fingertips to mid shin)      Strength   Right Hip Extension 4-/5    Right Hip ABduction 4/5    Right Knee Flexion 4+/5    Right Knee Extension 4+/5    Left Knee Flexion 4+/5    Left Knee Extension 4+/5                         [facility] Adult PT Treatment/Exercise - 09/25/20 0001      Self-Care   Self-Care Other Self-Care Comments    Other Self-Care Comments  Exam findings, POC update, FOTO      Exercises   Exercises Knee/Hip      Lumbar Exercises: Stretches   Passive Hamstring Stretch 2 reps;30 seconds    Passive Hamstring Stretch Limitations  seated edge of mat    Hip Flexor Stretch 2 reps;60 seconds    Hip Flexor Stretch Limitations thomas edge of mat    Other Lumbar Stretch Exercise Seated QL side bend stretch 5 x 10 sec    Other Lumbar Stretch Exercise Trialed child's pose stretch      Lumbar Exercises: Prone   Other Prone Lumbar Exercises Hip extension with 2 pilows under hips x 10 each                  PT Education - 09/25/20 1051    Education Details Update POC for 2 more visits, HEP    Person(s) Educated Patient    Methods Explanation;Demonstration;Tactile cues;Verbal cues;Handout    Comprehension Verbalized understanding;Need further instruction;Returned demonstration;Verbal cues required;Tactile cues required            PT Short Term Goals - 09/07/20 1349      PT SHORT TERM GOAL #1   Title Pt will be independent with initial HEP    Baseline independent, though not consistent    Time 2    Period Weeks    Status Achieved    Target Date 08/25/20      PT SHORT TERM GOAL #2   Title PT will go over FOTO results by third visit    Time 2    Period Weeks    Status Achieved    Target Date  08/25/20             PT Long Term Goals - 09/25/20 1057      PT LONG TERM GOAL #1   Title Pt will decrease lumbar AROM limitations to only 25% limited in all motions in order to help with bed mobility and ADLs/IADLs    Baseline Patient exhibits lumbar AROM grossly WFL, as expected post-surgery    Time --    Period --    Status Achieved    Target Date --      PT LONG TERM GOAL #2   Title Pt will achieve R hip strength MMTs of 4/5 in order to help with community ambulation and sit to stand transfers from lower levels    Baseline Patient continues to exhibit hip strength deficits, see flowsheet    Time 2    Period Weeks    Status On-going    Target Date 10/09/20      PT LONG TERM GOAL #3   Title Pt will increase L hip strength to 4+/5 in order to help with community ambulation and sit to stand transfers from lower levels    Baseline Patient continues to exhibit hip strength deficits, see flowsheet    Time 2    Period Weeks    Status On-going    Target Date 10/09/20      PT LONG TERM GOAL #4   Title Pt will be modified independent with all bed mobility so that she can sleep in her own bed and get onto/off of the couch.    Baseline Patient independent with all bed mobility    Time --    Period --    Status Achieved      PT LONG TERM GOAL #5   Title Pt will report < 3/10 LBP in order to help increase walking distance and ability to perform prolonged standing needed for IADLs    Baseline Patient report 0/10 pain at rest, 3-6/10 pain with standing and walking    Time 2    Period Weeks  Status On-going    Target Date 10/09/20                 Plan - 09/25/20 1053    Clinical Impression Statement Patient tolerated therapy well with no adverse effects. She is progressing well toward her LTGs but continues to report pain with standing/walking mainly right lower back, exhibits strength deficits of the gluteal region, and has not achieved functional status goal on FOTO.  Update HEP to include hip flexor/quad, hamstring, and QL stretching with good tolerance. Patient does exhibit tightness throughout hip and right lumbar region. Trialed using pillows under hips for prone hip extension and patient performed this better but did not add to HEP. Patient would benefit from continued skilled PT to progress mobility and strength in order to reduce pain and improve walking/standing ability.    PT Frequency 1x / week    PT Duration 2 weeks    PT Treatment/Interventions ADLs/Self Care Home Management;Aquatic Therapy;Joint Manipulations;Spinal Manipulations;Taping;Passive range of motion;Dry needling;Energy conservation;Manual techniques;Patient/family education;Gait training;Stair training;Functional mobility training;Therapeutic activities;Therapeutic exercise;Balance training;Neuromuscular re-education;Moist Heat;Cryotherapy;Electrical Stimulation    PT Next Visit Plan Review HEP and progress PRN, continue hip and lumbar stretching, progress core and hip strengthening    PT Home Exercise Plan Access Code: [email or web address]    Consulted and Agree with Plan of Care Patient           Patient will benefit from skilled therapeutic intervention in order to improve the following deficits and impairments:  Abnormal gait,Difficulty walking,Pain,Decreased range of motion,Cardiopulmonary status limiting activity,Decreased endurance,Decreased activity tolerance,Impaired sensation,Increased edema,Decreased strength,Decreased mobility,Postural dysfunction,Improper body mechanics,Hypomobility,Decreased balance,Increased muscle spasms,Dizziness  Visit Diagnosis: Chronic low back pain, unspecified back pain laterality, unspecified whether sciatica present  Muscle weakness (generalized)  Muscle spasm of back  Radiculopathy, lumbar region     Problem List Patient Active Problem List   Diagnosis Date Noted  . Dizziness 05/10/2016    Hilda Blades, PT, DPT, LAT, ATC 09/25/20  1:22  PM Phone: [phone] Fax: [facility] [address] [place], [place], [address] Phone: [phone]   Fax:  [phone]  Name: Nicole Herrera MRN: [id number] Date of Birth: 02-18-1946

## 2020-09-25 NOTE — Patient Instructions (Signed)
Access Code: [email or web address] URL: [email or web address] Date: 09/25/2020 Prepared by: Hilda Blades  Exercises Sit to Stand - 1 x daily - 7 x weekly - 2 sets - 8 reps Supine Lower Trunk Rotation - 1 x daily - 7 x weekly - 2 sets - 10 reps Sidelying Thoracic Rotation with Open Book - 1 x daily - 7 x weekly - 1 sets - 10 reps - 10 sec hold Modified Thomas Stretch - 1-2 x daily - 7 x weekly - 3 reps - 60 sec hold Seated Flexion Stretch - 1-2 x daily - 7 x weekly - 10 reps - 10 sec hold Seated Quadratus Lumborum Stretch with Arm Overhead - 1-2 x daily - 7 x weekly - 10 reps - 10 sec hold Seated Hamstring Stretch - 1 x daily - 7 x weekly - 3 reps - 30 sec hold Seated Calf Stretch with Strap - 1 x daily - 7 x weekly - 3 sets - 30 sec hold Standing Hip Abduction with Counter Support - 1 x daily - 7 x weekly - 2 sets - 10 reps Standing Hip Extension with Counter Support - 1 x daily - 7 x weekly - 2 sets - 10 reps Heel rises with counter support - 1 x daily - 7 x weekly - 2 sets - 10 reps Standing March with Counter Support - 1 x daily - 7 x weekly - 2 sets - 10 reps

## 2020-09-29 ENCOUNTER — Ambulatory Visit: Payer: Medicare HMO

## 2020-10-06 ENCOUNTER — Other Ambulatory Visit: Payer: Self-pay

## 2020-10-06 ENCOUNTER — Ambulatory Visit: Payer: Medicare HMO

## 2020-10-06 DIAGNOSIS — M6281 Muscle weakness (generalized): Secondary | ICD-10-CM | POA: Diagnosis not present

## 2020-10-06 DIAGNOSIS — M545 Low back pain, unspecified: Secondary | ICD-10-CM | POA: Diagnosis not present

## 2020-10-06 DIAGNOSIS — M5416 Radiculopathy, lumbar region: Secondary | ICD-10-CM | POA: Diagnosis not present

## 2020-10-06 DIAGNOSIS — G8929 Other chronic pain: Secondary | ICD-10-CM | POA: Diagnosis not present

## 2020-10-06 DIAGNOSIS — M62830 Muscle spasm of back: Secondary | ICD-10-CM

## 2020-10-06 NOTE — Therapy (Signed)
[place], [place], [address] Phone: [phone]   Fax:  [phone]  Physical Therapy Treatment/Discharge  Patient Details  Name: Nicole Herrera MRN: [id number] Date of Birth: 03/03/1946 Referring Provider (PT): Starling Manns, MD   Encounter Date: 10/06/2020   PT End of Session - 10/06/20 1139    Visit Number 11    Number of Visits 12    Date for PT Re-Evaluation 10/09/20    Authorization Type [employer] MCR    Authorization Time Period 3/22-5/31/22    Authorization - Visit Number 10    Authorization - Number of Visits 12    Progress Note Due on Visit 20    PT Start Time 1142    PT Stop Time 1230    PT Time Calculation (min) 48 min    Activity Tolerance Patient tolerated treatment well    Behavior During Therapy [facility] for tasks assessed/performed           Past Medical History:  Diagnosis Date  . Acid reflux   . Barrett's esophagus   . Hyperlipemia   . Hypertension   . Migraine   . Neck pain   . Sinusitis   . Tinnitus   . Trigger finger   . Vertigo     Past Surgical History:  Procedure Laterality Date  . ABDOMINAL HYSTERECTOMY    . NASAL SINUS SURGERY    . NECK SURGERY    . TRIGGER FINGER RELEASE      There were no vitals filed for this visit.   Subjective Assessment - 10/06/20 1146    Subjective Patient reports the hip flexor stretch caused increased pain in her back and groin. She cancelled last week's visit due to her pain being flared up and attributes this to this particular stretch. She reports the Rt sided back pain is coming and going described as tightness. She has f/u with Dr. Patrice Paradise at the end of June.    How long can you sit comfortably? 45 minutes    How long can you stand comfortably? 2.5 hours    How long can you walk comfortably? "I'm not walking enough" (not pain related, but just wants to get back into walking)    Patient Stated Goals strengthen my legs    Currently in  Pain? Yes    Pain Score 6     Pain Location Back    Pain Orientation Right;Lower    Pain Descriptors / Indicators Tightness    Pain Type Surgical pain    Pain Onset More than a month ago    Pain Frequency Intermittent    Aggravating Factors  prolonged position    Pain Relieving Factors movement              [facility] PT Assessment - 10/06/20 0001      Assessment   Medical Diagnosis L4-S1 TLIF PSF    Referring Provider (PT) Starling Manns, MD    Onset Date/Surgical Date 04/27/20      Observation/Other Assessments   Focus on Therapeutic Outcomes (FOTO)  65%      AROM   Lumbar Flexion 25% limited tightness low back    Lumbar Extension WNL    Lumbar - Right Side Bend WNL    Lumbar - Left Side Bend 25% limitation tightness Rt low back    Lumbar - Right Rotation WNL    Lumbar - Left Rotation WNL      Strength   Right  Hip Flexion 4+/5    Right Hip Extension 4+/5    Right Hip ABduction 4/5    Left Hip Flexion 5/5    Left Hip Extension 4+/5    Left Hip ABduction 4+/5    Right Knee Flexion 5/5    Right Knee Extension 5/5    Left Knee Flexion 5/5    Left Knee Extension 5/5                         [facility] Adult PT Treatment/Exercise - 10/06/20 0001      Self-Care   Other Self-Care Comments  see patient education      Lumbar Exercises: Stretches   Passive Hamstring Stretch 30 seconds    Passive Hamstring Stretch Limitations bilateral    Single Knee to Chest Stretch Limitations 1 x 10 bilateral    Quad Stretch 30 seconds    Quad Stretch Limitations bilateral    Figure 4 Stretch 30 seconds    Figure 4 Stretch Limitations bilateral    Other Lumbar Stretch Exercise seated trunk flexion 1 x 5    Other Lumbar Stretch Exercise QL doorway stretch 30 sec each      Lumbar Exercises: Sidelying   Hip Abduction 10 reps    Hip Abduction Limitations bilateral      Lumbar Exercises: Prone   Straight Leg Raise 10 reps    Straight Leg Raises Limitations bilateral       Knee/Hip Exercises: Standing   Hip Extension 5 reps    Extension Limitations bilateral                  PT Education - 10/06/20 1218    Education Details Education on overall progress, FOTO score, discharge education. updated HEP.    Person(s) Educated Patient    Methods Explanation;Verbal cues;Handout;Demonstration    Comprehension Verbalized understanding;Returned demonstration            PT Short Term Goals - 09/07/20 1349      PT SHORT TERM GOAL #1   Title Pt will be independent with initial HEP    Baseline independent, though not consistent    Time 2    Period Weeks    Status Achieved    Target Date 08/25/20      PT SHORT TERM GOAL #2   Title PT will go over FOTO results by third visit    Time 2    Period Weeks    Status Achieved    Target Date 08/25/20             PT Long Term Goals - 10/06/20 1206      PT LONG TERM GOAL #1   Title Pt will decrease lumbar AROM limitations to only 25% limited in all motions in order to help with bed mobility and ADLs/IADLs    Baseline Patient exhibits lumbar AROM grossly WFL, as expected post-surgery    Status Achieved      PT LONG TERM GOAL #2   Title Pt will achieve R hip strength MMTs of 4/5 in order to help with community ambulation and sit to stand transfers from lower levels    Baseline see flowsheet    Time 2    Period Weeks    Status Achieved      PT LONG TERM GOAL #3   Title Pt will increase L hip strength to 4+/5 in order to help with community ambulation and sit to stand transfers from lower  levels    Baseline see flowsheet    Time 2    Period Weeks    Status Achieved      PT LONG TERM GOAL #4   Title Pt will be modified independent with all bed mobility so that she can sleep in her own bed and get onto/off of the couch.    Baseline Patient independent with all bed mobility    Status Achieved      PT LONG TERM GOAL #5   Title Pt will report < 3/10 LBP in order to help increase walking distance  and ability to perform prolonged standing needed for IADLs    Baseline 6/10 pain upon arrival described as tightness.    Time 2    Period Weeks    Status On-going                 Plan - 10/06/20 1216    Clinical Impression Statement Patient has progressed well since start of care having met the majority of established functional goals. She continues to have intermittent Rt sided low back pain described as tightness that is related to prolonged positioning. Her lumbar AROM has much improved with mild limitations remaining in trunk flexion and left lateral flexion. She demonstrates improvements in bilateral hip strength with minor weakness remaining in hip extensors and abductors. She scored at 65% function on her FOTO outcome survey, which is higher than the predicted score. She is pleased with her overall progress and demonstrates independence with advanced home program to further progress her mobility and strength. She is therefore appropriate for discharge at this time.    PT Frequency --    PT Duration --    PT Treatment/Interventions ADLs/Self Care Home Management;Aquatic Therapy;Joint Manipulations;Spinal Manipulations;Taping;Passive range of motion;Dry needling;Energy conservation;Manual techniques;Patient/family education;Gait training;Stair training;Functional mobility training;Therapeutic activities;Therapeutic exercise;Balance training;Neuromuscular re-education;Moist Heat;Cryotherapy;Electrical Stimulation    PT Next Visit Plan --    PT Home Exercise Plan Access Code: [email or web address]    Consulted and Agree with Plan of Care Patient           Patient will benefit from skilled therapeutic intervention in order to improve the following deficits and impairments:  Abnormal gait,Difficulty walking,Pain,Decreased range of motion,Cardiopulmonary status limiting activity,Decreased endurance,Decreased activity tolerance,Impaired sensation,Increased edema,Decreased strength,Decreased  mobility,Postural dysfunction,Improper body mechanics,Hypomobility,Decreased balance,Increased muscle spasms,Dizziness  Visit Diagnosis: Chronic low back pain, unspecified back pain laterality, unspecified whether sciatica present  Muscle weakness (generalized)  Muscle spasm of back  Radiculopathy, lumbar region     Problem List Patient Active Problem List   Diagnosis Date Noted  . Dizziness 05/10/2016  PHYSICAL THERAPY DISCHARGE SUMMARY  Visits from Start of Care: 11  Current functional level related to goals / functional outcomes: See above   Remaining deficits: See above   Education / Equipment: See above   Plan: Patient agrees to discharge.  Patient goals were partially met. Patient is being discharged due to being pleased with the current functional level.  ?????          Gwendolyn Grant, PT, DPT, ATC 10/06/20 2:12 PM  [facility] [address] [place], [place], [address] Phone: [phone]   Fax:  [phone]  Name: ABBIGAEL DETLEFSEN MRN: [id number] Date of Birth: [birth year]

## 2020-10-20 ENCOUNTER — Encounter: Payer: Self-pay | Admitting: Physical Therapy

## 2020-10-30 DIAGNOSIS — M79642 Pain in left hand: Secondary | ICD-10-CM | POA: Diagnosis not present

## 2020-10-30 DIAGNOSIS — M25532 Pain in left wrist: Secondary | ICD-10-CM | POA: Diagnosis not present

## 2020-11-16 DIAGNOSIS — I10 Essential (primary) hypertension: Secondary | ICD-10-CM | POA: Diagnosis not present

## 2020-11-16 DIAGNOSIS — E785 Hyperlipidemia, unspecified: Secondary | ICD-10-CM | POA: Diagnosis not present

## 2020-11-16 DIAGNOSIS — K219 Gastro-esophageal reflux disease without esophagitis: Secondary | ICD-10-CM | POA: Diagnosis not present

## 2020-11-17 DIAGNOSIS — M48062 Spinal stenosis, lumbar region with neurogenic claudication: Secondary | ICD-10-CM | POA: Diagnosis not present

## 2020-11-17 DIAGNOSIS — I10 Essential (primary) hypertension: Secondary | ICD-10-CM | POA: Diagnosis not present

## 2020-11-17 DIAGNOSIS — Z6836 Body mass index (BMI) 36.0-36.9, adult: Secondary | ICD-10-CM | POA: Diagnosis not present

## 2020-11-17 DIAGNOSIS — M4326 Fusion of spine, lumbar region: Secondary | ICD-10-CM | POA: Diagnosis not present

## 2020-11-21 DIAGNOSIS — M79645 Pain in left finger(s): Secondary | ICD-10-CM | POA: Diagnosis not present

## 2020-11-27 DIAGNOSIS — M79645 Pain in left finger(s): Secondary | ICD-10-CM | POA: Diagnosis not present

## 2020-11-27 DIAGNOSIS — M654 Radial styloid tenosynovitis [de Quervain]: Secondary | ICD-10-CM | POA: Diagnosis not present

## 2020-12-02 DIAGNOSIS — M79642 Pain in left hand: Secondary | ICD-10-CM | POA: Diagnosis not present

## 2020-12-18 DIAGNOSIS — M79642 Pain in left hand: Secondary | ICD-10-CM | POA: Diagnosis not present

## 2020-12-22 DIAGNOSIS — M79642 Pain in left hand: Secondary | ICD-10-CM | POA: Diagnosis not present

## 2020-12-24 DIAGNOSIS — M79641 Pain in right hand: Secondary | ICD-10-CM | POA: Diagnosis not present

## 2020-12-25 DIAGNOSIS — M79642 Pain in left hand: Secondary | ICD-10-CM | POA: Diagnosis not present

## 2021-01-01 DIAGNOSIS — M79642 Pain in left hand: Secondary | ICD-10-CM | POA: Diagnosis not present

## 2021-01-05 DIAGNOSIS — M79642 Pain in left hand: Secondary | ICD-10-CM | POA: Diagnosis not present

## 2021-01-06 DIAGNOSIS — Z79899 Other long term (current) drug therapy: Secondary | ICD-10-CM | POA: Diagnosis not present

## 2021-01-06 DIAGNOSIS — Z1211 Encounter for screening for malignant neoplasm of colon: Secondary | ICD-10-CM | POA: Diagnosis not present

## 2021-01-06 DIAGNOSIS — D649 Anemia, unspecified: Secondary | ICD-10-CM | POA: Diagnosis not present

## 2021-01-06 DIAGNOSIS — R1010 Upper abdominal pain, unspecified: Secondary | ICD-10-CM | POA: Diagnosis not present

## 2021-01-06 DIAGNOSIS — K2270 Barrett's esophagus without dysplasia: Secondary | ICD-10-CM | POA: Diagnosis not present

## 2021-01-12 DIAGNOSIS — M4326 Fusion of spine, lumbar region: Secondary | ICD-10-CM | POA: Diagnosis not present

## 2021-01-14 DIAGNOSIS — M654 Radial styloid tenosynovitis [de Quervain]: Secondary | ICD-10-CM | POA: Diagnosis not present

## 2021-01-14 DIAGNOSIS — M79645 Pain in left finger(s): Secondary | ICD-10-CM | POA: Diagnosis not present

## 2021-01-22 DIAGNOSIS — K219 Gastro-esophageal reflux disease without esophagitis: Secondary | ICD-10-CM | POA: Diagnosis not present

## 2021-01-22 DIAGNOSIS — E785 Hyperlipidemia, unspecified: Secondary | ICD-10-CM | POA: Diagnosis not present

## 2021-01-22 DIAGNOSIS — I10 Essential (primary) hypertension: Secondary | ICD-10-CM | POA: Diagnosis not present

## 2021-01-22 DIAGNOSIS — D649 Anemia, unspecified: Secondary | ICD-10-CM | POA: Diagnosis not present

## 2021-01-22 DIAGNOSIS — J309 Allergic rhinitis, unspecified: Secondary | ICD-10-CM | POA: Diagnosis not present

## 2021-01-22 DIAGNOSIS — Z79899 Other long term (current) drug therapy: Secondary | ICD-10-CM | POA: Diagnosis not present

## 2021-01-22 DIAGNOSIS — R1013 Epigastric pain: Secondary | ICD-10-CM | POA: Diagnosis not present

## 2021-01-27 DIAGNOSIS — Z4789 Encounter for other orthopedic aftercare: Secondary | ICD-10-CM | POA: Diagnosis not present

## 2021-01-27 DIAGNOSIS — M65832 Other synovitis and tenosynovitis, left forearm: Secondary | ICD-10-CM | POA: Diagnosis not present

## 2021-02-08 DIAGNOSIS — M25632 Stiffness of left wrist, not elsewhere classified: Secondary | ICD-10-CM | POA: Diagnosis not present

## 2021-02-15 DIAGNOSIS — M79642 Pain in left hand: Secondary | ICD-10-CM | POA: Diagnosis not present

## 2021-02-25 DIAGNOSIS — M79642 Pain in left hand: Secondary | ICD-10-CM | POA: Diagnosis not present

## 2021-03-02 DIAGNOSIS — M4326 Fusion of spine, lumbar region: Secondary | ICD-10-CM | POA: Diagnosis not present

## 2021-03-02 DIAGNOSIS — M791 Myalgia, unspecified site: Secondary | ICD-10-CM | POA: Diagnosis not present

## 2021-03-03 DIAGNOSIS — K2281 Esophageal polyp: Secondary | ICD-10-CM | POA: Diagnosis not present

## 2021-03-03 DIAGNOSIS — K2270 Barrett's esophagus without dysplasia: Secondary | ICD-10-CM | POA: Diagnosis not present

## 2021-03-03 DIAGNOSIS — R109 Unspecified abdominal pain: Secondary | ICD-10-CM | POA: Diagnosis not present

## 2021-03-03 DIAGNOSIS — K317 Polyp of stomach and duodenum: Secondary | ICD-10-CM | POA: Diagnosis not present

## 2021-03-05 DIAGNOSIS — M79642 Pain in left hand: Secondary | ICD-10-CM | POA: Diagnosis not present

## 2021-03-08 DIAGNOSIS — R1010 Upper abdominal pain, unspecified: Secondary | ICD-10-CM | POA: Diagnosis not present

## 2021-03-08 DIAGNOSIS — K760 Fatty (change of) liver, not elsewhere classified: Secondary | ICD-10-CM | POA: Diagnosis not present

## 2021-03-08 DIAGNOSIS — K7689 Other specified diseases of liver: Secondary | ICD-10-CM | POA: Diagnosis not present

## 2021-03-08 DIAGNOSIS — R109 Unspecified abdominal pain: Secondary | ICD-10-CM | POA: Diagnosis not present

## 2021-03-08 DIAGNOSIS — K449 Diaphragmatic hernia without obstruction or gangrene: Secondary | ICD-10-CM | POA: Diagnosis not present

## 2021-03-12 DIAGNOSIS — M79642 Pain in left hand: Secondary | ICD-10-CM | POA: Diagnosis not present

## 2021-04-01 DIAGNOSIS — D120 Benign neoplasm of cecum: Secondary | ICD-10-CM | POA: Diagnosis not present

## 2021-04-01 DIAGNOSIS — Z1211 Encounter for screening for malignant neoplasm of colon: Secondary | ICD-10-CM | POA: Diagnosis not present

## 2021-04-01 DIAGNOSIS — R1010 Upper abdominal pain, unspecified: Secondary | ICD-10-CM | POA: Diagnosis not present

## 2021-04-01 DIAGNOSIS — K635 Polyp of colon: Secondary | ICD-10-CM | POA: Diagnosis not present

## 2021-04-01 DIAGNOSIS — D124 Benign neoplasm of descending colon: Secondary | ICD-10-CM | POA: Diagnosis not present

## 2021-04-01 DIAGNOSIS — D122 Benign neoplasm of ascending colon: Secondary | ICD-10-CM | POA: Diagnosis not present

## 2021-04-01 DIAGNOSIS — K648 Other hemorrhoids: Secondary | ICD-10-CM | POA: Diagnosis not present

## 2021-04-01 DIAGNOSIS — G8929 Other chronic pain: Secondary | ICD-10-CM | POA: Diagnosis not present

## 2021-04-01 DIAGNOSIS — R109 Unspecified abdominal pain: Secondary | ICD-10-CM | POA: Diagnosis not present

## 2021-04-07 ENCOUNTER — Other Ambulatory Visit: Payer: Self-pay

## 2021-04-07 ENCOUNTER — Ambulatory Visit: Payer: Medicare HMO | Attending: Orthopaedic Surgery

## 2021-04-07 DIAGNOSIS — G8929 Other chronic pain: Secondary | ICD-10-CM | POA: Diagnosis not present

## 2021-04-07 DIAGNOSIS — M6281 Muscle weakness (generalized): Secondary | ICD-10-CM | POA: Diagnosis not present

## 2021-04-07 DIAGNOSIS — M545 Low back pain, unspecified: Secondary | ICD-10-CM | POA: Diagnosis not present

## 2021-04-07 DIAGNOSIS — R262 Difficulty in walking, not elsewhere classified: Secondary | ICD-10-CM | POA: Diagnosis not present

## 2021-04-07 DIAGNOSIS — M4326 Fusion of spine, lumbar region: Secondary | ICD-10-CM | POA: Diagnosis not present

## 2021-04-07 NOTE — Therapy (Addendum)
[place], [place], [address] Phone: [phone]   Fax:  [phone]  Physical Therapy Evaluation/Discharge  Patient Details  Name: Nicole Herrera MRN: [id number] Date of Birth: [birth year] Referring Provider (PT): Starling Manns, MD   Encounter Date: 04/07/2021   PT End of Session - 04/07/21 1607     Visit Number 1    Number of Visits 7    Date for PT Re-Evaluation 05/19/21    Authorization Type [employer]- requesting auth    PT Start Time 1530    PT Stop Time 1615    PT Time Calculation (min) 45 min    Activity Tolerance Patient tolerated treatment well    Behavior During Therapy [facility] for tasks assessed/performed             Past Medical History:  Diagnosis Date   Acid reflux    Barrett's esophagus    Hyperlipemia    Hypertension    Migraine    Neck pain    Sinusitis    Tinnitus    Trigger finger    Vertigo     Past Surgical History:  Procedure Laterality Date   ABDOMINAL HYSTERECTOMY     NASAL SINUS SURGERY     NECK SURGERY     TRIGGER FINGER RELEASE      There were no vitals filed for this visit.    Subjective Assessment - 04/07/21 1532     Subjective Patient says she has "tightness in low back", recently had an injection to resolve pain but it did not relieve any pain. "I've done stretching and heat and TENS and that helps a little bit". Pt says she has not been doing her old HEP from previous therapy, but that when she does a few stretches it helps. Pt tried not to take pain medication today and lasted about an hour and half then had to take medication. "I had one day where a heat pack decreased pain for a day." But pt has been unable to recreate that scenario. She says she feels "wobbly in shower and with getting out". Has shower seat. Change in bowel due to colonoscopy and endoscopy.  N/T goes from back into Rt knee. Pt says exercises and stretches help alot but that she needs to know  what to do at home.    Pertinent History hypertension, lumbar fusion L4-S1 04/27/20,    Limitations Sitting;Standing;Walking    How long can you sit comfortably? 60mnutes to hour    How long can you stand comfortably? undetermined    How long can you walk comfortably? 0.5 - 1 mile    Patient Stated Goals "I want stretches and exercises to help wit pain and tightness"    Currently in Pain? Yes    Pain Score 3     Pain Location Back    Pain Orientation Right    Pain Descriptors / Indicators Tightness;Tingling;Radiating    Pain Type Chronic pain    Pain Radiating Towards into leg a little bit    Pain Onset More than a month ago    Pain Frequency Constant   intermittantly gets worse   Aggravating Factors  sitting too long,walking especially    Pain Relieving Factors heat, rest on towel, stretches    Effect of Pain on Daily Activities dont feel secure on feet in shower.            [facility]  Therapeutic Exercise: - seated piriformis stretch  with green strap for Lt leg assist. 2x30secs - instructed on piriformis and hamstring stretch in HEP.   Manual Therapy: - NA  Neuromuscular re-ed: - NA  Therapeutic Activity: - NA  Self-care/Home Management: - see patient education     [facility] PT Assessment - 04/08/21 0001       Assessment   Medical Diagnosis Spinal fusion lumbar region    Referring Provider (PT) Starling Manns, MD    Onset Date/Surgical Date 04/27/20    Hand Dominance Left    Next MD Visit 07/08/2021    Prior Therapy previous physical therapy      Precautions   Precaution Comments no traction, spinal fusion      Balance Screen   Has the patient fallen in the past 6 months Yes    How many times? [place] residence    Type of [place] seat      Prior Function   Level of [place] Retired      Associate Professor   Overall Cognitive Status  Within Functional Limits for tasks assessed      Observation/Other Assessments   Observations mild swelling and taut skin around ankles bilat    Focus on Therapeutic Outcomes (FOTO)  52% function to 58% predicted      Sensation   Light Touch Appears Intact   altered sensation on Rt medial malleolus and Rt dorsal aspect of the foot.     Coordination   Gross Motor Movements are Fluid and Coordinated Yes      Single Leg Stance   Comments Rt: 3 sec; Lt: 3 sec      AROM   Lumbar Flexion fingers past knees   pain in low back   Lumbar Extension 75% limited, pain in low back    Lumbar - Right Side Bend fingers to knee   pain in low back   Lumbar - Left Side Bend fingers to knee   pain in low back   Lumbar - Right Rotation WFL    Lumbar - Left Rotation [facility]      Strength   Right Hip Flexion 3+/5    Right Hip Extension 3+/5    Right Hip External Rotation  4/5    Right Hip Internal Rotation 4/5    Right Hip ABduction 4-/5    Left Hip Flexion 4-/5    Left Hip Extension 4-/5    Left Hip External Rotation 4/5    Left Hip Internal Rotation 4/5    Left Hip ABduction 4-/5    Right Knee Flexion 4+/5    Right Knee Extension 4+/5    Left Knee Flexion 4+/5    Left Knee Extension 4+/5    Right Ankle Dorsiflexion 4-/5    Left Ankle Dorsiflexion 4-/5      Flexibility   Hamstrings Moderate restriction bilat    Quadriceps moderate restriction bilat      Palpation   Spinal mobility CPAs hypomobile T1-L1; > pain in L1-L5      Special Tests   Other special tests double leg standing balance EO and EC: WFL; Tandem: unable to achieve position with Lt foot forward; Rt foot forward maintained position for 1-2 secs.      Transfers   Five time sit to stand comments  15 seconds, bilat UE support  Objective measurements completed on examination: See above findings.                PT Education - 04/07/21 1718     Education Details Discussed  etiology, plan of care, self management plan and expected progression, PT diagnosis, HEP, prognosis, and progression of plan of care for return to functional activities. Instructed on initial HEP and expected progression.    Person(s) Educated Patient    Methods Explanation;Handout    Comprehension Verbalized understanding              PT Short Term Goals - 04/07/21 1719       PT SHORT TERM GOAL #1   Title Pt will be independant with initial HEP to improve pain management and self management of condition.    Baseline issued at eval    Status New    Target Date 04/29/21      PT SHORT TERM GOAL #2   Title Pt will be able to complete 5xSTS in 14 seconds without UE support to indicate improvement in power and strenth in lower extrimity musculature to improve sit to stand function.    Baseline see flowsheet    Status New    Target Date 04/29/21      PT SHORT TERM GOAL #3   Title Pt will be able to achieve tandem stance bilat and hold for 5 seconds to improve stability in standing and walking.    Baseline see flowsheet    Status New    Target Date 04/29/21               PT Long Term Goals - 04/07/21 1721       PT LONG TERM GOAL #1   Title Pt will be able to achieve 58% predicted function on FOTO to indicate clinically meaningful improvement in function.    Baseline see flow sheet    Status New    Target Date 05/19/21      PT LONG TERM GOAL #2   Title Pt will achieve bilat hip strength MMTs of 4+/5 in order to help with community ambulation and sit to stand transfers from lower level surfaces.    Baseline see flowsheet    Status New    Target Date 05/19/21      PT LONG TERM GOAL #3   Title Pt will be able to achieve single leg balance of 10 seconds bilat to improve stability during walking and standing.    Baseline see flowsheet    Status New    Target Date 05/19/21                    Plan - 04/07/21 1710     Clinical Impression Statement Patient is  a 75 y.o. female that presents to OPPT s/p spinal fusion of L4-S1 TLIF, PSF on 04/27/20. Pt presents with pain and mobility deficits of trunk in flexion, extension, and side bending. Additional impairments include hip strength deficits, decreased stability of lumbopelvic region, poor balance, and decreased muscular endurance. These impairments are limiting the patients ability to stand, sit, and walk without pain and stiffness in low back. Pt would benefit from skilled physical therapy to improve the deficits stated above and improve patients function to optimize quality of life.    Personal Factors and Comorbidities Age;Comorbidity 2    Comorbidities hypertension, obesity    Examination-Activity Limitations Sit;Bend;Stand;Stairs;Squat    Examination-Participation Restrictions Community Activity    Stability/Clinical Decision Making Stable/Uncomplicated  Clinical Decision Making Low    Rehab Potential Good    PT Frequency 1x / week    PT Duration 6 weeks    PT Treatment/Interventions ADLs/Self Care Home Management;Aquatic Therapy;Cryotherapy;Electrical Stimulation;Moist Heat;Ultrasound;Stair training;Functional mobility training;Therapeutic activities;Therapeutic exercise;Balance training;Neuromuscular re-education;Patient/family education;Manual techniques;Passive range of motion;Dry needling;Taping;Spinal Manipulations;Joint Manipulations    PT Next Visit Plan Progress HEP    PT Home Exercise Plan [email or web address]    Consulted and Agree with Plan of Care Patient             Patient will benefit from skilled therapeutic intervention in order to improve the following deficits and impairments:  Decreased range of motion, Decreased endurance, Decreased activity tolerance, Impaired perceived functional ability, Pain, Decreased balance, Hypomobility, Decreased mobility, Decreased strength, Impaired sensation  Visit Diagnosis: Chronic bilateral low back pain, unspecified whether sciatica  present  Muscle weakness (generalized)  Difficulty in walking, not elsewhere classified     Problem List Patient Active Problem List   Diagnosis Date Noted   Dizziness 05/10/2016   Referring diagnosis?  Spinal fusion lumbar region  Treatment diagnosis? (if different than referring diagnosis) Chronic bilateral low back pain, unspecified whether sciatica present;Muscle weakness (generalized); Difficulty in walking, not elsewhere classified What was this (referring dx) caused by? [x]  Surgery []  Fall [x]  Ongoing issue []  Arthritis []  Other: ____________  Laterality: []  Rt []  Lt [x]  Both  Check all possible CPT codes:      [x]  97110 (Therapeutic Exercise)  []  92507 (SLP Treatment)  [x]  97112 (Neuro Re-ed)   []  92526 (Swallowing Treatment)   [x]  97116 (Gait Training)   []  [id number] (Cognitive Training, 1st 15 minutes) [x]  97140 (Manual Therapy)   []  97130 (Cognitive Training, each add'l 15 minutes)  [x]  97530 (Therapeutic Activities)  []  Other, List CPT Code ____________    [x]  60630 (Self Care)       []  All codes above (97110 - 97535)  []  97012 (Mechanical Traction)  [x]  97014 (E-stim Unattended)  [x]  97032 (E-stim manual)  []  97033 (Ionto)  [x]  97035 (Ultrasound)  []  97760 (Orthotic Fit) []  97750 (Physical Performance Training) [x]  [id number] (Aquatic Therapy) []  97034 (Contrast Bath) []  [id number] (Paraffin) []  97597 (Wound Care 1st 20 sq cm) []  97598 (Wound Care each add'l 20 sq cm) []  97016 (Vasopneumatic Device) []  [id number] Comptroller) []  [id number] (Prosthetic Training)  Glade Lloyd, SPT 04/08/21 3:06 PM  PHYSICAL THERAPY DISCHARGE SUMMARY  Visits from Start of Care: 1  Current functional level related to goals / functional outcomes: No formal re-assessment of goals.    Remaining deficits: Status unknown.   Education / Equipment: N/A   Patient agrees to discharge. Patient goals were not met. Patient is being discharged due to not returning since the last  visit. Gwendolyn Grant, PT, DPT, ATC 06/08/21 10:04 AM  [facility] [address] [place], [place], [address] Phone: [phone]   Fax:  [phone]  Name: ALYXIS GRIPPI MRN: [id number] Date of Birth: 1945-12-18

## 2021-04-21 ENCOUNTER — Ambulatory Visit: Payer: Medicare HMO | Admitting: Physical Therapy

## 2021-04-26 DIAGNOSIS — M65331 Trigger finger, right middle finger: Secondary | ICD-10-CM | POA: Diagnosis not present

## 2021-05-14 DIAGNOSIS — M791 Myalgia, unspecified site: Secondary | ICD-10-CM | POA: Diagnosis not present

## 2021-05-14 DIAGNOSIS — M4326 Fusion of spine, lumbar region: Secondary | ICD-10-CM | POA: Diagnosis not present

## 2021-05-14 DIAGNOSIS — M545 Low back pain, unspecified: Secondary | ICD-10-CM | POA: Diagnosis not present

## 2021-05-26 ENCOUNTER — Other Ambulatory Visit: Payer: Self-pay | Admitting: Orthopaedic Surgery

## 2021-05-26 DIAGNOSIS — M4326 Fusion of spine, lumbar region: Secondary | ICD-10-CM

## 2021-06-09 ENCOUNTER — Other Ambulatory Visit: Payer: BC Managed Care – PPO

## 2021-06-15 ENCOUNTER — Other Ambulatory Visit: Payer: Self-pay

## 2021-06-15 ENCOUNTER — Ambulatory Visit
Admission: RE | Admit: 2021-06-15 | Discharge: 2021-06-15 | Disposition: A | Discharge status: Home / Self Care | Payer: Medicare HMO | Source: Ambulatory Visit | Attending: Orthopaedic Surgery | Admitting: Orthopaedic Surgery

## 2021-06-15 DIAGNOSIS — M4326 Fusion of spine, lumbar region: Secondary | ICD-10-CM

## 2021-06-15 DIAGNOSIS — M48061 Spinal stenosis, lumbar region without neurogenic claudication: Secondary | ICD-10-CM | POA: Diagnosis not present

## 2021-06-15 DIAGNOSIS — M545 Low back pain, unspecified: Secondary | ICD-10-CM | POA: Diagnosis not present

## 2021-06-18 DIAGNOSIS — M791 Myalgia, unspecified site: Secondary | ICD-10-CM | POA: Diagnosis not present

## 2021-06-18 DIAGNOSIS — M4326 Fusion of spine, lumbar region: Secondary | ICD-10-CM | POA: Diagnosis not present

## 2021-06-18 DIAGNOSIS — M47896 Other spondylosis, lumbar region: Secondary | ICD-10-CM | POA: Diagnosis not present

## 2021-06-18 DIAGNOSIS — Z79891 Long term (current) use of opiate analgesic: Secondary | ICD-10-CM | POA: Diagnosis not present

## 2021-06-18 DIAGNOSIS — M5136 Other intervertebral disc degeneration, lumbar region: Secondary | ICD-10-CM | POA: Diagnosis not present

## 2021-06-18 DIAGNOSIS — Z79899 Other long term (current) drug therapy: Secondary | ICD-10-CM | POA: Diagnosis not present

## 2021-06-18 DIAGNOSIS — G894 Chronic pain syndrome: Secondary | ICD-10-CM | POA: Diagnosis not present

## 2021-06-24 DIAGNOSIS — M5116 Intervertebral disc disorders with radiculopathy, lumbar region: Secondary | ICD-10-CM | POA: Diagnosis not present

## 2021-06-24 DIAGNOSIS — M4326 Fusion of spine, lumbar region: Secondary | ICD-10-CM | POA: Diagnosis not present

## 2021-06-24 DIAGNOSIS — Z6836 Body mass index (BMI) 36.0-36.9, adult: Secondary | ICD-10-CM | POA: Diagnosis not present

## 2021-06-24 DIAGNOSIS — M47816 Spondylosis without myelopathy or radiculopathy, lumbar region: Secondary | ICD-10-CM | POA: Diagnosis not present

## 2021-06-29 DIAGNOSIS — B9689 Other specified bacterial agents as the cause of diseases classified elsewhere: Secondary | ICD-10-CM | POA: Diagnosis not present

## 2021-06-29 DIAGNOSIS — I10 Essential (primary) hypertension: Secondary | ICD-10-CM | POA: Diagnosis not present

## 2021-06-29 DIAGNOSIS — J208 Acute bronchitis due to other specified organisms: Secondary | ICD-10-CM | POA: Diagnosis not present

## 2021-06-30 ENCOUNTER — Other Ambulatory Visit: Payer: Self-pay | Admitting: Family Medicine

## 2021-06-30 DIAGNOSIS — Z1231 Encounter for screening mammogram for malignant neoplasm of breast: Secondary | ICD-10-CM

## 2021-07-02 DIAGNOSIS — J208 Acute bronchitis due to other specified organisms: Secondary | ICD-10-CM | POA: Diagnosis not present

## 2021-07-14 DIAGNOSIS — J40 Bronchitis, not specified as acute or chronic: Secondary | ICD-10-CM | POA: Diagnosis not present

## 2021-08-11 DIAGNOSIS — M5116 Intervertebral disc disorders with radiculopathy, lumbar region: Secondary | ICD-10-CM | POA: Diagnosis not present

## 2021-08-16 DIAGNOSIS — Z0000 Encounter for general adult medical examination without abnormal findings: Secondary | ICD-10-CM | POA: Diagnosis not present

## 2021-08-16 DIAGNOSIS — K2270 Barrett's esophagus without dysplasia: Secondary | ICD-10-CM | POA: Diagnosis not present

## 2021-08-16 DIAGNOSIS — E785 Hyperlipidemia, unspecified: Secondary | ICD-10-CM | POA: Diagnosis not present

## 2021-08-16 DIAGNOSIS — D649 Anemia, unspecified: Secondary | ICD-10-CM | POA: Diagnosis not present

## 2021-08-16 DIAGNOSIS — K219 Gastro-esophageal reflux disease without esophagitis: Secondary | ICD-10-CM | POA: Diagnosis not present

## 2021-08-16 DIAGNOSIS — M5136 Other intervertebral disc degeneration, lumbar region: Secondary | ICD-10-CM | POA: Diagnosis not present

## 2021-08-16 DIAGNOSIS — I10 Essential (primary) hypertension: Secondary | ICD-10-CM | POA: Diagnosis not present

## 2021-08-16 DIAGNOSIS — J309 Allergic rhinitis, unspecified: Secondary | ICD-10-CM | POA: Diagnosis not present

## 2021-08-19 ENCOUNTER — Ambulatory Visit: Payer: Medicare HMO

## 2021-08-25 DIAGNOSIS — M5116 Intervertebral disc disorders with radiculopathy, lumbar region: Secondary | ICD-10-CM | POA: Diagnosis not present

## 2021-08-25 DIAGNOSIS — M4326 Fusion of spine, lumbar region: Secondary | ICD-10-CM | POA: Diagnosis not present

## 2021-08-25 DIAGNOSIS — M47816 Spondylosis without myelopathy or radiculopathy, lumbar region: Secondary | ICD-10-CM | POA: Diagnosis not present

## 2021-09-03 DIAGNOSIS — M65331 Trigger finger, right middle finger: Secondary | ICD-10-CM | POA: Diagnosis not present

## 2021-09-03 DIAGNOSIS — M79641 Pain in right hand: Secondary | ICD-10-CM | POA: Diagnosis not present

## 2021-09-16 ENCOUNTER — Ambulatory Visit (INDEPENDENT_AMBULATORY_CARE_PROVIDER_SITE_OTHER): Payer: Medicare HMO

## 2021-09-16 DIAGNOSIS — Z1231 Encounter for screening mammogram for malignant neoplasm of breast: Secondary | ICD-10-CM

## 2021-10-01 DIAGNOSIS — Z0100 Encounter for examination of eyes and vision without abnormal findings: Secondary | ICD-10-CM | POA: Diagnosis not present

## 2021-10-01 DIAGNOSIS — H5203 Hypermetropia, bilateral: Secondary | ICD-10-CM | POA: Diagnosis not present

## 2021-10-13 DIAGNOSIS — M65331 Trigger finger, right middle finger: Secondary | ICD-10-CM | POA: Diagnosis not present

## 2021-10-13 DIAGNOSIS — M13842 Other specified arthritis, left hand: Secondary | ICD-10-CM | POA: Diagnosis not present

## 2021-10-13 DIAGNOSIS — M79641 Pain in right hand: Secondary | ICD-10-CM | POA: Diagnosis not present

## 2021-10-13 DIAGNOSIS — R2231 Localized swelling, mass and lump, right upper limb: Secondary | ICD-10-CM | POA: Diagnosis not present

## 2021-10-21 DIAGNOSIS — M48062 Spinal stenosis, lumbar region with neurogenic claudication: Secondary | ICD-10-CM | POA: Diagnosis not present

## 2021-10-21 DIAGNOSIS — M532X6 Spinal instabilities, lumbar region: Secondary | ICD-10-CM | POA: Diagnosis not present

## 2021-12-10 DIAGNOSIS — M48062 Spinal stenosis, lumbar region with neurogenic claudication: Secondary | ICD-10-CM | POA: Diagnosis not present

## 2021-12-10 DIAGNOSIS — M532X6 Spinal instabilities, lumbar region: Secondary | ICD-10-CM | POA: Diagnosis not present

## 2021-12-16 DIAGNOSIS — Z01818 Encounter for other preprocedural examination: Secondary | ICD-10-CM | POA: Diagnosis not present

## 2021-12-16 DIAGNOSIS — Z01810 Encounter for preprocedural cardiovascular examination: Secondary | ICD-10-CM | POA: Diagnosis not present

## 2021-12-16 DIAGNOSIS — M48062 Spinal stenosis, lumbar region with neurogenic claudication: Secondary | ICD-10-CM | POA: Diagnosis not present

## 2021-12-20 DIAGNOSIS — R2231 Localized swelling, mass and lump, right upper limb: Secondary | ICD-10-CM | POA: Diagnosis not present

## 2021-12-20 DIAGNOSIS — M13842 Other specified arthritis, left hand: Secondary | ICD-10-CM | POA: Diagnosis not present

## 2021-12-20 DIAGNOSIS — M65331 Trigger finger, right middle finger: Secondary | ICD-10-CM | POA: Diagnosis not present

## 2021-12-21 DIAGNOSIS — M4326 Fusion of spine, lumbar region: Secondary | ICD-10-CM | POA: Diagnosis not present

## 2021-12-21 DIAGNOSIS — M4306 Spondylolysis, lumbar region: Secondary | ICD-10-CM | POA: Diagnosis not present

## 2021-12-21 DIAGNOSIS — Z9889 Other specified postprocedural states: Secondary | ICD-10-CM | POA: Diagnosis not present

## 2021-12-21 DIAGNOSIS — M5116 Intervertebral disc disorders with radiculopathy, lumbar region: Secondary | ICD-10-CM | POA: Diagnosis not present

## 2021-12-21 DIAGNOSIS — M532X6 Spinal instabilities, lumbar region: Secondary | ICD-10-CM | POA: Diagnosis not present

## 2021-12-21 DIAGNOSIS — M4716 Other spondylosis with myelopathy, lumbar region: Secondary | ICD-10-CM | POA: Diagnosis not present

## 2021-12-21 DIAGNOSIS — M5030 Other cervical disc degeneration, unspecified cervical region: Secondary | ICD-10-CM | POA: Diagnosis not present

## 2021-12-21 DIAGNOSIS — Z981 Arthrodesis status: Secondary | ICD-10-CM | POA: Diagnosis not present

## 2021-12-21 DIAGNOSIS — R55 Syncope and collapse: Secondary | ICD-10-CM | POA: Diagnosis not present

## 2021-12-21 DIAGNOSIS — M4726 Other spondylosis with radiculopathy, lumbar region: Secondary | ICD-10-CM | POA: Diagnosis not present

## 2021-12-21 DIAGNOSIS — M5136 Other intervertebral disc degeneration, lumbar region: Secondary | ICD-10-CM | POA: Diagnosis not present

## 2021-12-21 DIAGNOSIS — Z4889 Encounter for other specified surgical aftercare: Secondary | ICD-10-CM | POA: Diagnosis not present

## 2021-12-21 DIAGNOSIS — K219 Gastro-esophageal reflux disease without esophagitis: Secondary | ICD-10-CM | POA: Diagnosis not present

## 2021-12-21 DIAGNOSIS — M545 Low back pain, unspecified: Secondary | ICD-10-CM | POA: Diagnosis not present

## 2021-12-21 DIAGNOSIS — I959 Hypotension, unspecified: Secondary | ICD-10-CM | POA: Diagnosis not present

## 2021-12-21 DIAGNOSIS — M48062 Spinal stenosis, lumbar region with neurogenic claudication: Secondary | ICD-10-CM | POA: Diagnosis not present

## 2021-12-21 DIAGNOSIS — M5126 Other intervertebral disc displacement, lumbar region: Secondary | ICD-10-CM | POA: Diagnosis not present

## 2021-12-21 DIAGNOSIS — E785 Hyperlipidemia, unspecified: Secondary | ICD-10-CM | POA: Diagnosis not present

## 2021-12-21 DIAGNOSIS — M5106 Intervertebral disc disorders with myelopathy, lumbar region: Secondary | ICD-10-CM | POA: Diagnosis not present

## 2021-12-21 DIAGNOSIS — I10 Essential (primary) hypertension: Secondary | ICD-10-CM | POA: Diagnosis not present

## 2021-12-21 DIAGNOSIS — M5416 Radiculopathy, lumbar region: Secondary | ICD-10-CM | POA: Diagnosis not present

## 2021-12-21 DIAGNOSIS — D508 Other iron deficiency anemias: Secondary | ICD-10-CM | POA: Diagnosis not present

## 2021-12-22 DIAGNOSIS — M4326 Fusion of spine, lumbar region: Secondary | ICD-10-CM | POA: Diagnosis not present

## 2021-12-22 DIAGNOSIS — Z981 Arthrodesis status: Secondary | ICD-10-CM | POA: Diagnosis not present

## 2021-12-22 DIAGNOSIS — Z9889 Other specified postprocedural states: Secondary | ICD-10-CM | POA: Diagnosis not present

## 2021-12-24 DIAGNOSIS — E785 Hyperlipidemia, unspecified: Secondary | ICD-10-CM | POA: Diagnosis not present

## 2021-12-24 DIAGNOSIS — M545 Low back pain, unspecified: Secondary | ICD-10-CM | POA: Diagnosis not present

## 2021-12-24 DIAGNOSIS — D508 Other iron deficiency anemias: Secondary | ICD-10-CM | POA: Diagnosis not present

## 2021-12-24 DIAGNOSIS — M5116 Intervertebral disc disorders with radiculopathy, lumbar region: Secondary | ICD-10-CM | POA: Diagnosis not present

## 2021-12-24 DIAGNOSIS — M546 Pain in thoracic spine: Secondary | ICD-10-CM | POA: Diagnosis not present

## 2021-12-24 DIAGNOSIS — M5136 Other intervertebral disc degeneration, lumbar region: Secondary | ICD-10-CM | POA: Diagnosis not present

## 2021-12-24 DIAGNOSIS — M4326 Fusion of spine, lumbar region: Secondary | ICD-10-CM | POA: Diagnosis not present

## 2021-12-24 DIAGNOSIS — M48062 Spinal stenosis, lumbar region with neurogenic claudication: Secondary | ICD-10-CM | POA: Diagnosis not present

## 2021-12-24 DIAGNOSIS — M5126 Other intervertebral disc displacement, lumbar region: Secondary | ICD-10-CM | POA: Diagnosis not present

## 2021-12-24 DIAGNOSIS — I10 Essential (primary) hypertension: Secondary | ICD-10-CM | POA: Diagnosis not present

## 2021-12-24 DIAGNOSIS — M5030 Other cervical disc degeneration, unspecified cervical region: Secondary | ICD-10-CM | POA: Diagnosis not present

## 2021-12-24 DIAGNOSIS — Z4889 Encounter for other specified surgical aftercare: Secondary | ICD-10-CM | POA: Diagnosis not present

## 2021-12-24 DIAGNOSIS — K219 Gastro-esophageal reflux disease without esophagitis: Secondary | ICD-10-CM | POA: Diagnosis not present

## 2021-12-28 DIAGNOSIS — I10 Essential (primary) hypertension: Secondary | ICD-10-CM | POA: Diagnosis not present

## 2021-12-31 ENCOUNTER — Other Ambulatory Visit: Payer: Self-pay | Admitting: Orthopaedic Surgery

## 2021-12-31 ENCOUNTER — Other Ambulatory Visit: Payer: Self-pay | Admitting: Internal Medicine

## 2021-12-31 DIAGNOSIS — I10 Essential (primary) hypertension: Secondary | ICD-10-CM | POA: Diagnosis not present

## 2021-12-31 DIAGNOSIS — M5116 Intervertebral disc disorders with radiculopathy, lumbar region: Secondary | ICD-10-CM

## 2021-12-31 DIAGNOSIS — M5136 Other intervertebral disc degeneration, lumbar region: Secondary | ICD-10-CM

## 2021-12-31 DIAGNOSIS — M4326 Fusion of spine, lumbar region: Secondary | ICD-10-CM

## 2021-12-31 DIAGNOSIS — D508 Other iron deficiency anemias: Secondary | ICD-10-CM | POA: Diagnosis not present

## 2021-12-31 DIAGNOSIS — M545 Low back pain, unspecified: Secondary | ICD-10-CM

## 2021-12-31 DIAGNOSIS — M48061 Spinal stenosis, lumbar region without neurogenic claudication: Secondary | ICD-10-CM

## 2021-12-31 DIAGNOSIS — M48062 Spinal stenosis, lumbar region with neurogenic claudication: Secondary | ICD-10-CM | POA: Diagnosis not present

## 2021-12-31 DIAGNOSIS — Z4889 Encounter for other specified surgical aftercare: Secondary | ICD-10-CM

## 2021-12-31 DIAGNOSIS — M5030 Other cervical disc degeneration, unspecified cervical region: Secondary | ICD-10-CM

## 2021-12-31 DIAGNOSIS — E785 Hyperlipidemia, unspecified: Secondary | ICD-10-CM | POA: Diagnosis not present

## 2021-12-31 DIAGNOSIS — R2689 Other abnormalities of gait and mobility: Secondary | ICD-10-CM

## 2021-12-31 DIAGNOSIS — K219 Gastro-esophageal reflux disease without esophagitis: Secondary | ICD-10-CM | POA: Diagnosis not present

## 2022-01-03 DIAGNOSIS — E785 Hyperlipidemia, unspecified: Secondary | ICD-10-CM | POA: Diagnosis not present

## 2022-01-03 DIAGNOSIS — M48062 Spinal stenosis, lumbar region with neurogenic claudication: Secondary | ICD-10-CM | POA: Diagnosis not present

## 2022-01-03 DIAGNOSIS — D508 Other iron deficiency anemias: Secondary | ICD-10-CM | POA: Diagnosis not present

## 2022-01-03 DIAGNOSIS — K219 Gastro-esophageal reflux disease without esophagitis: Secondary | ICD-10-CM | POA: Diagnosis not present

## 2022-01-13 DIAGNOSIS — M546 Pain in thoracic spine: Secondary | ICD-10-CM | POA: Diagnosis not present

## 2022-01-13 DIAGNOSIS — I10 Essential (primary) hypertension: Secondary | ICD-10-CM | POA: Diagnosis not present

## 2022-01-13 DIAGNOSIS — M4326 Fusion of spine, lumbar region: Secondary | ICD-10-CM | POA: Diagnosis not present

## 2022-01-13 DIAGNOSIS — Z6839 Body mass index (BMI) 39.0-39.9, adult: Secondary | ICD-10-CM | POA: Diagnosis not present

## 2022-01-14 ENCOUNTER — Ambulatory Visit
Admission: RE | Admit: 2022-01-14 | Discharge: 2022-01-14 | Disposition: A | Discharge status: Home / Self Care | Payer: Medicare HMO | Source: Ambulatory Visit | Attending: Internal Medicine | Admitting: Internal Medicine

## 2022-01-14 DIAGNOSIS — Z981 Arthrodesis status: Secondary | ICD-10-CM | POA: Diagnosis not present

## 2022-01-14 DIAGNOSIS — M545 Low back pain, unspecified: Secondary | ICD-10-CM

## 2022-01-14 DIAGNOSIS — M5136 Other intervertebral disc degeneration, lumbar region: Secondary | ICD-10-CM

## 2022-01-14 DIAGNOSIS — M5030 Other cervical disc degeneration, unspecified cervical region: Secondary | ICD-10-CM

## 2022-01-14 DIAGNOSIS — M48061 Spinal stenosis, lumbar region without neurogenic claudication: Secondary | ICD-10-CM

## 2022-01-14 DIAGNOSIS — M51369 Other intervertebral disc degeneration, lumbar region without mention of lumbar back pain or lower extremity pain: Secondary | ICD-10-CM

## 2022-01-14 DIAGNOSIS — Z4889 Encounter for other specified surgical aftercare: Secondary | ICD-10-CM | POA: Diagnosis not present

## 2022-01-14 DIAGNOSIS — M4326 Fusion of spine, lumbar region: Secondary | ICD-10-CM

## 2022-01-14 DIAGNOSIS — R2689 Other abnormalities of gait and mobility: Secondary | ICD-10-CM

## 2022-01-14 DIAGNOSIS — M5116 Intervertebral disc disorders with radiculopathy, lumbar region: Secondary | ICD-10-CM

## 2022-01-14 MED ORDER — GADOBENATE DIMEGLUMINE 529 MG/ML IV SOLN
20.0000 mL | Freq: Once | INTRAVENOUS | Status: AC | PRN
  Administered 2022-01-14: 20 mL via INTRAVENOUS

## 2022-01-19 DIAGNOSIS — Z4789 Encounter for other orthopedic aftercare: Secondary | ICD-10-CM | POA: Diagnosis not present

## 2022-01-19 DIAGNOSIS — M48062 Spinal stenosis, lumbar region with neurogenic claudication: Secondary | ICD-10-CM | POA: Diagnosis not present

## 2022-01-19 DIAGNOSIS — Z981 Arthrodesis status: Secondary | ICD-10-CM | POA: Diagnosis not present

## 2022-01-22 DIAGNOSIS — L03115 Cellulitis of right lower limb: Secondary | ICD-10-CM | POA: Diagnosis not present

## 2022-01-22 DIAGNOSIS — S80811A Abrasion, right lower leg, initial encounter: Secondary | ICD-10-CM | POA: Diagnosis not present

## 2022-01-22 DIAGNOSIS — L089 Local infection of the skin and subcutaneous tissue, unspecified: Secondary | ICD-10-CM | POA: Diagnosis not present

## 2022-02-14 DIAGNOSIS — G894 Chronic pain syndrome: Secondary | ICD-10-CM | POA: Diagnosis not present

## 2022-02-14 DIAGNOSIS — Z79899 Other long term (current) drug therapy: Secondary | ICD-10-CM | POA: Diagnosis not present

## 2022-02-14 DIAGNOSIS — Z79891 Long term (current) use of opiate analgesic: Secondary | ICD-10-CM | POA: Diagnosis not present

## 2022-02-17 DIAGNOSIS — K219 Gastro-esophageal reflux disease without esophagitis: Secondary | ICD-10-CM | POA: Diagnosis not present

## 2022-02-17 DIAGNOSIS — I10 Essential (primary) hypertension: Secondary | ICD-10-CM | POA: Diagnosis not present

## 2022-02-17 DIAGNOSIS — K2270 Barrett's esophagus without dysplasia: Secondary | ICD-10-CM | POA: Diagnosis not present

## 2022-02-17 DIAGNOSIS — E785 Hyperlipidemia, unspecified: Secondary | ICD-10-CM | POA: Diagnosis not present

## 2022-02-17 DIAGNOSIS — D649 Anemia, unspecified: Secondary | ICD-10-CM | POA: Diagnosis not present

## 2022-03-04 DIAGNOSIS — Z981 Arthrodesis status: Secondary | ICD-10-CM | POA: Diagnosis not present

## 2022-03-09 DIAGNOSIS — M4326 Fusion of spine, lumbar region: Secondary | ICD-10-CM | POA: Diagnosis not present

## 2022-03-31 DIAGNOSIS — R2231 Localized swelling, mass and lump, right upper limb: Secondary | ICD-10-CM | POA: Diagnosis not present

## 2022-03-31 DIAGNOSIS — M13842 Other specified arthritis, left hand: Secondary | ICD-10-CM | POA: Diagnosis not present

## 2022-03-31 DIAGNOSIS — M65331 Trigger finger, right middle finger: Secondary | ICD-10-CM | POA: Diagnosis not present

## 2022-03-31 DIAGNOSIS — M79641 Pain in right hand: Secondary | ICD-10-CM | POA: Diagnosis not present

## 2022-04-07 DIAGNOSIS — M4326 Fusion of spine, lumbar region: Secondary | ICD-10-CM | POA: Diagnosis not present

## 2022-04-07 DIAGNOSIS — I10 Essential (primary) hypertension: Secondary | ICD-10-CM | POA: Diagnosis not present

## 2022-04-07 DIAGNOSIS — Z6839 Body mass index (BMI) 39.0-39.9, adult: Secondary | ICD-10-CM | POA: Diagnosis not present

## 2022-04-07 DIAGNOSIS — M48062 Spinal stenosis, lumbar region with neurogenic claudication: Secondary | ICD-10-CM | POA: Diagnosis not present

## 2022-04-26 DIAGNOSIS — M5451 Vertebrogenic low back pain: Secondary | ICD-10-CM | POA: Diagnosis not present

## 2022-04-27 DIAGNOSIS — M48062 Spinal stenosis, lumbar region with neurogenic claudication: Secondary | ICD-10-CM | POA: Diagnosis not present

## 2022-05-02 DIAGNOSIS — M461 Sacroiliitis, not elsewhere classified: Secondary | ICD-10-CM | POA: Diagnosis not present

## 2022-05-02 DIAGNOSIS — M545 Low back pain, unspecified: Secondary | ICD-10-CM | POA: Diagnosis not present

## 2022-05-06 DIAGNOSIS — M5451 Vertebrogenic low back pain: Secondary | ICD-10-CM | POA: Diagnosis not present

## 2022-05-09 DIAGNOSIS — M5451 Vertebrogenic low back pain: Secondary | ICD-10-CM | POA: Diagnosis not present

## 2022-05-13 DIAGNOSIS — M461 Sacroiliitis, not elsewhere classified: Secondary | ICD-10-CM | POA: Diagnosis not present

## 2022-05-19 DIAGNOSIS — M5451 Vertebrogenic low back pain: Secondary | ICD-10-CM | POA: Diagnosis not present

## 2022-05-27 DIAGNOSIS — M5451 Vertebrogenic low back pain: Secondary | ICD-10-CM | POA: Diagnosis not present

## 2022-05-30 DIAGNOSIS — M5451 Vertebrogenic low back pain: Secondary | ICD-10-CM | POA: Diagnosis not present

## 2022-06-13 DIAGNOSIS — M7062 Trochanteric bursitis, left hip: Secondary | ICD-10-CM | POA: Diagnosis not present

## 2022-06-13 DIAGNOSIS — M4326 Fusion of spine, lumbar region: Secondary | ICD-10-CM | POA: Diagnosis not present

## 2022-06-13 DIAGNOSIS — M461 Sacroiliitis, not elsewhere classified: Secondary | ICD-10-CM | POA: Diagnosis not present

## 2022-06-22 DIAGNOSIS — M545 Low back pain, unspecified: Secondary | ICD-10-CM | POA: Diagnosis not present

## 2022-06-22 DIAGNOSIS — M7061 Trochanteric bursitis, right hip: Secondary | ICD-10-CM | POA: Diagnosis not present

## 2022-07-01 DIAGNOSIS — M7061 Trochanteric bursitis, right hip: Secondary | ICD-10-CM | POA: Diagnosis not present

## 2022-07-15 DIAGNOSIS — M461 Sacroiliitis, not elsewhere classified: Secondary | ICD-10-CM | POA: Diagnosis not present

## 2022-07-29 DIAGNOSIS — M5417 Radiculopathy, lumbosacral region: Secondary | ICD-10-CM | POA: Diagnosis not present

## 2022-08-05 DIAGNOSIS — M5417 Radiculopathy, lumbosacral region: Secondary | ICD-10-CM | POA: Diagnosis not present

## 2022-08-08 DIAGNOSIS — Z79891 Long term (current) use of opiate analgesic: Secondary | ICD-10-CM | POA: Diagnosis not present

## 2022-08-08 DIAGNOSIS — M461 Sacroiliitis, not elsewhere classified: Secondary | ICD-10-CM | POA: Diagnosis not present

## 2022-08-08 DIAGNOSIS — G894 Chronic pain syndrome: Secondary | ICD-10-CM | POA: Diagnosis not present

## 2022-08-08 DIAGNOSIS — Z79899 Other long term (current) drug therapy: Secondary | ICD-10-CM | POA: Diagnosis not present

## 2022-08-12 DIAGNOSIS — M5417 Radiculopathy, lumbosacral region: Secondary | ICD-10-CM | POA: Diagnosis not present

## 2022-08-19 DIAGNOSIS — M5417 Radiculopathy, lumbosacral region: Secondary | ICD-10-CM | POA: Diagnosis not present

## 2022-08-22 ENCOUNTER — Other Ambulatory Visit: Payer: Self-pay | Admitting: Family Medicine

## 2022-08-22 DIAGNOSIS — Z1231 Encounter for screening mammogram for malignant neoplasm of breast: Secondary | ICD-10-CM

## 2022-08-26 DIAGNOSIS — M5417 Radiculopathy, lumbosacral region: Secondary | ICD-10-CM | POA: Diagnosis not present

## 2022-09-05 DIAGNOSIS — G5702 Lesion of sciatic nerve, left lower limb: Secondary | ICD-10-CM | POA: Diagnosis not present

## 2022-09-05 DIAGNOSIS — M545 Low back pain, unspecified: Secondary | ICD-10-CM | POA: Diagnosis not present

## 2022-09-09 DIAGNOSIS — M5417 Radiculopathy, lumbosacral region: Secondary | ICD-10-CM | POA: Diagnosis not present

## 2022-09-13 DIAGNOSIS — M5417 Radiculopathy, lumbosacral region: Secondary | ICD-10-CM | POA: Diagnosis not present

## 2022-09-21 ENCOUNTER — Ambulatory Visit (INDEPENDENT_AMBULATORY_CARE_PROVIDER_SITE_OTHER): Payer: Medicare HMO

## 2022-09-21 DIAGNOSIS — Z1231 Encounter for screening mammogram for malignant neoplasm of breast: Secondary | ICD-10-CM | POA: Diagnosis not present

## 2022-09-23 DIAGNOSIS — M5417 Radiculopathy, lumbosacral region: Secondary | ICD-10-CM | POA: Diagnosis not present

## 2022-09-28 DIAGNOSIS — M48062 Spinal stenosis, lumbar region with neurogenic claudication: Secondary | ICD-10-CM | POA: Diagnosis not present

## 2022-09-28 DIAGNOSIS — M4326 Fusion of spine, lumbar region: Secondary | ICD-10-CM | POA: Diagnosis not present

## 2022-10-04 DIAGNOSIS — M4326 Fusion of spine, lumbar region: Secondary | ICD-10-CM | POA: Diagnosis not present

## 2022-10-04 DIAGNOSIS — M461 Sacroiliitis, not elsewhere classified: Secondary | ICD-10-CM | POA: Diagnosis not present

## 2022-10-04 DIAGNOSIS — Z6839 Body mass index (BMI) 39.0-39.9, adult: Secondary | ICD-10-CM | POA: Diagnosis not present

## 2022-10-12 DIAGNOSIS — M461 Sacroiliitis, not elsewhere classified: Secondary | ICD-10-CM | POA: Diagnosis not present

## 2022-10-17 DIAGNOSIS — R21 Rash and other nonspecific skin eruption: Secondary | ICD-10-CM | POA: Diagnosis not present

## 2022-10-20 DIAGNOSIS — H903 Sensorineural hearing loss, bilateral: Secondary | ICD-10-CM | POA: Diagnosis not present

## 2022-10-31 DIAGNOSIS — R2231 Localized swelling, mass and lump, right upper limb: Secondary | ICD-10-CM | POA: Diagnosis not present

## 2022-10-31 DIAGNOSIS — M65331 Trigger finger, right middle finger: Secondary | ICD-10-CM | POA: Diagnosis not present

## 2022-11-07 DIAGNOSIS — I872 Venous insufficiency (chronic) (peripheral): Secondary | ICD-10-CM | POA: Diagnosis not present

## 2022-11-07 DIAGNOSIS — I8393 Asymptomatic varicose veins of bilateral lower extremities: Secondary | ICD-10-CM | POA: Diagnosis not present

## 2022-11-07 DIAGNOSIS — L03031 Cellulitis of right toe: Secondary | ICD-10-CM | POA: Diagnosis not present

## 2022-11-07 DIAGNOSIS — L03032 Cellulitis of left toe: Secondary | ICD-10-CM | POA: Diagnosis not present

## 2022-11-16 DIAGNOSIS — M5136 Other intervertebral disc degeneration, lumbar region: Secondary | ICD-10-CM | POA: Diagnosis not present

## 2022-11-16 DIAGNOSIS — Z0000 Encounter for general adult medical examination without abnormal findings: Secondary | ICD-10-CM | POA: Diagnosis not present

## 2022-11-16 DIAGNOSIS — Z79899 Other long term (current) drug therapy: Secondary | ICD-10-CM | POA: Diagnosis not present

## 2022-11-16 DIAGNOSIS — J309 Allergic rhinitis, unspecified: Secondary | ICD-10-CM | POA: Diagnosis not present

## 2022-11-16 DIAGNOSIS — E785 Hyperlipidemia, unspecified: Secondary | ICD-10-CM | POA: Diagnosis not present

## 2022-11-16 DIAGNOSIS — K2270 Barrett's esophagus without dysplasia: Secondary | ICD-10-CM | POA: Diagnosis not present

## 2022-11-16 DIAGNOSIS — I10 Essential (primary) hypertension: Secondary | ICD-10-CM | POA: Diagnosis not present

## 2022-11-16 DIAGNOSIS — K219 Gastro-esophageal reflux disease without esophagitis: Secondary | ICD-10-CM | POA: Diagnosis not present

## 2022-11-22 ENCOUNTER — Ambulatory Visit: Payer: Medicare PPO | Admitting: Podiatry

## 2022-11-23 DIAGNOSIS — M792 Neuralgia and neuritis, unspecified: Secondary | ICD-10-CM | POA: Diagnosis not present

## 2022-11-23 DIAGNOSIS — G5761 Lesion of plantar nerve, right lower limb: Secondary | ICD-10-CM | POA: Diagnosis not present

## 2022-11-23 DIAGNOSIS — M19071 Primary osteoarthritis, right ankle and foot: Secondary | ICD-10-CM | POA: Diagnosis not present

## 2022-11-23 DIAGNOSIS — M205X2 Other deformities of toe(s) (acquired), left foot: Secondary | ICD-10-CM | POA: Diagnosis not present

## 2022-11-23 DIAGNOSIS — M205X1 Other deformities of toe(s) (acquired), right foot: Secondary | ICD-10-CM | POA: Diagnosis not present

## 2022-11-23 DIAGNOSIS — M19072 Primary osteoarthritis, left ankle and foot: Secondary | ICD-10-CM | POA: Diagnosis not present

## 2022-11-23 DIAGNOSIS — I872 Venous insufficiency (chronic) (peripheral): Secondary | ICD-10-CM | POA: Diagnosis not present

## 2022-12-02 DIAGNOSIS — R233 Spontaneous ecchymoses: Secondary | ICD-10-CM | POA: Diagnosis not present

## 2022-12-02 DIAGNOSIS — S50812A Abrasion of left forearm, initial encounter: Secondary | ICD-10-CM | POA: Diagnosis not present

## 2022-12-07 DIAGNOSIS — M461 Sacroiliitis, not elsewhere classified: Secondary | ICD-10-CM | POA: Diagnosis not present

## 2022-12-16 DIAGNOSIS — M6281 Muscle weakness (generalized): Secondary | ICD-10-CM | POA: Diagnosis not present

## 2022-12-16 DIAGNOSIS — M79652 Pain in left thigh: Secondary | ICD-10-CM | POA: Diagnosis not present

## 2022-12-16 DIAGNOSIS — M545 Low back pain, unspecified: Secondary | ICD-10-CM | POA: Diagnosis not present

## 2022-12-16 DIAGNOSIS — R293 Abnormal posture: Secondary | ICD-10-CM | POA: Diagnosis not present

## 2022-12-16 DIAGNOSIS — M79651 Pain in right thigh: Secondary | ICD-10-CM | POA: Diagnosis not present

## 2022-12-16 DIAGNOSIS — M4326 Fusion of spine, lumbar region: Secondary | ICD-10-CM | POA: Diagnosis not present

## 2022-12-16 DIAGNOSIS — M5416 Radiculopathy, lumbar region: Secondary | ICD-10-CM | POA: Diagnosis not present

## 2022-12-16 DIAGNOSIS — M2569 Stiffness of other specified joint, not elsewhere classified: Secondary | ICD-10-CM | POA: Diagnosis not present

## 2022-12-21 DIAGNOSIS — M5416 Radiculopathy, lumbar region: Secondary | ICD-10-CM | POA: Diagnosis not present

## 2022-12-21 DIAGNOSIS — M79652 Pain in left thigh: Secondary | ICD-10-CM | POA: Diagnosis not present

## 2022-12-21 DIAGNOSIS — M6281 Muscle weakness (generalized): Secondary | ICD-10-CM | POA: Diagnosis not present

## 2022-12-21 DIAGNOSIS — M79651 Pain in right thigh: Secondary | ICD-10-CM | POA: Diagnosis not present

## 2022-12-21 DIAGNOSIS — R293 Abnormal posture: Secondary | ICD-10-CM | POA: Diagnosis not present

## 2022-12-21 DIAGNOSIS — M2569 Stiffness of other specified joint, not elsewhere classified: Secondary | ICD-10-CM | POA: Diagnosis not present

## 2022-12-23 DIAGNOSIS — R293 Abnormal posture: Secondary | ICD-10-CM | POA: Diagnosis not present

## 2022-12-23 DIAGNOSIS — M79651 Pain in right thigh: Secondary | ICD-10-CM | POA: Diagnosis not present

## 2022-12-23 DIAGNOSIS — M2569 Stiffness of other specified joint, not elsewhere classified: Secondary | ICD-10-CM | POA: Diagnosis not present

## 2022-12-23 DIAGNOSIS — M79652 Pain in left thigh: Secondary | ICD-10-CM | POA: Diagnosis not present

## 2022-12-23 DIAGNOSIS — M6281 Muscle weakness (generalized): Secondary | ICD-10-CM | POA: Diagnosis not present

## 2022-12-23 DIAGNOSIS — M5416 Radiculopathy, lumbar region: Secondary | ICD-10-CM | POA: Diagnosis not present

## 2022-12-26 DIAGNOSIS — M6281 Muscle weakness (generalized): Secondary | ICD-10-CM | POA: Diagnosis not present

## 2022-12-26 DIAGNOSIS — G5762 Lesion of plantar nerve, left lower limb: Secondary | ICD-10-CM | POA: Diagnosis not present

## 2022-12-26 DIAGNOSIS — M79651 Pain in right thigh: Secondary | ICD-10-CM | POA: Diagnosis not present

## 2022-12-26 DIAGNOSIS — R293 Abnormal posture: Secondary | ICD-10-CM | POA: Diagnosis not present

## 2022-12-26 DIAGNOSIS — M2569 Stiffness of other specified joint, not elsewhere classified: Secondary | ICD-10-CM | POA: Diagnosis not present

## 2022-12-26 DIAGNOSIS — M79652 Pain in left thigh: Secondary | ICD-10-CM | POA: Diagnosis not present

## 2022-12-26 DIAGNOSIS — G5761 Lesion of plantar nerve, right lower limb: Secondary | ICD-10-CM | POA: Diagnosis not present

## 2022-12-26 DIAGNOSIS — M5416 Radiculopathy, lumbar region: Secondary | ICD-10-CM | POA: Diagnosis not present

## 2022-12-28 DIAGNOSIS — Z79899 Other long term (current) drug therapy: Secondary | ICD-10-CM | POA: Diagnosis not present

## 2022-12-28 DIAGNOSIS — G894 Chronic pain syndrome: Secondary | ICD-10-CM | POA: Diagnosis not present

## 2022-12-28 DIAGNOSIS — Z79891 Long term (current) use of opiate analgesic: Secondary | ICD-10-CM | POA: Diagnosis not present

## 2022-12-28 DIAGNOSIS — M4326 Fusion of spine, lumbar region: Secondary | ICD-10-CM | POA: Diagnosis not present

## 2022-12-28 DIAGNOSIS — Z6839 Body mass index (BMI) 39.0-39.9, adult: Secondary | ICD-10-CM | POA: Diagnosis not present

## 2022-12-29 DIAGNOSIS — R293 Abnormal posture: Secondary | ICD-10-CM | POA: Diagnosis not present

## 2022-12-29 DIAGNOSIS — M6281 Muscle weakness (generalized): Secondary | ICD-10-CM | POA: Diagnosis not present

## 2022-12-29 DIAGNOSIS — M79652 Pain in left thigh: Secondary | ICD-10-CM | POA: Diagnosis not present

## 2022-12-29 DIAGNOSIS — M5416 Radiculopathy, lumbar region: Secondary | ICD-10-CM | POA: Diagnosis not present

## 2022-12-29 DIAGNOSIS — M79651 Pain in right thigh: Secondary | ICD-10-CM | POA: Diagnosis not present

## 2022-12-29 DIAGNOSIS — M2569 Stiffness of other specified joint, not elsewhere classified: Secondary | ICD-10-CM | POA: Diagnosis not present

## 2023-01-02 DIAGNOSIS — M6281 Muscle weakness (generalized): Secondary | ICD-10-CM | POA: Diagnosis not present

## 2023-01-02 DIAGNOSIS — R293 Abnormal posture: Secondary | ICD-10-CM | POA: Diagnosis not present

## 2023-01-02 DIAGNOSIS — M2569 Stiffness of other specified joint, not elsewhere classified: Secondary | ICD-10-CM | POA: Diagnosis not present

## 2023-01-02 DIAGNOSIS — M5416 Radiculopathy, lumbar region: Secondary | ICD-10-CM | POA: Diagnosis not present

## 2023-01-02 DIAGNOSIS — M79651 Pain in right thigh: Secondary | ICD-10-CM | POA: Diagnosis not present

## 2023-01-02 DIAGNOSIS — M79652 Pain in left thigh: Secondary | ICD-10-CM | POA: Diagnosis not present

## 2023-01-04 DIAGNOSIS — L570 Actinic keratosis: Secondary | ICD-10-CM | POA: Diagnosis not present

## 2023-01-04 DIAGNOSIS — C44622 Squamous cell carcinoma of skin of right upper limb, including shoulder: Secondary | ICD-10-CM | POA: Diagnosis not present

## 2023-01-04 DIAGNOSIS — D225 Melanocytic nevi of trunk: Secondary | ICD-10-CM | POA: Diagnosis not present

## 2023-01-04 DIAGNOSIS — L814 Other melanin hyperpigmentation: Secondary | ICD-10-CM | POA: Diagnosis not present

## 2023-01-04 DIAGNOSIS — D692 Other nonthrombocytopenic purpura: Secondary | ICD-10-CM | POA: Diagnosis not present

## 2023-01-04 DIAGNOSIS — L821 Other seborrheic keratosis: Secondary | ICD-10-CM | POA: Diagnosis not present

## 2023-01-04 DIAGNOSIS — D485 Neoplasm of uncertain behavior of skin: Secondary | ICD-10-CM | POA: Diagnosis not present

## 2023-01-05 DIAGNOSIS — M79652 Pain in left thigh: Secondary | ICD-10-CM | POA: Diagnosis not present

## 2023-01-05 DIAGNOSIS — M5416 Radiculopathy, lumbar region: Secondary | ICD-10-CM | POA: Diagnosis not present

## 2023-01-05 DIAGNOSIS — M2569 Stiffness of other specified joint, not elsewhere classified: Secondary | ICD-10-CM | POA: Diagnosis not present

## 2023-01-05 DIAGNOSIS — R293 Abnormal posture: Secondary | ICD-10-CM | POA: Diagnosis not present

## 2023-01-05 DIAGNOSIS — M6281 Muscle weakness (generalized): Secondary | ICD-10-CM | POA: Diagnosis not present

## 2023-01-05 DIAGNOSIS — M79651 Pain in right thigh: Secondary | ICD-10-CM | POA: Diagnosis not present

## 2023-01-09 DIAGNOSIS — M6281 Muscle weakness (generalized): Secondary | ICD-10-CM | POA: Diagnosis not present

## 2023-01-09 DIAGNOSIS — M79651 Pain in right thigh: Secondary | ICD-10-CM | POA: Diagnosis not present

## 2023-01-09 DIAGNOSIS — M79652 Pain in left thigh: Secondary | ICD-10-CM | POA: Diagnosis not present

## 2023-01-09 DIAGNOSIS — R293 Abnormal posture: Secondary | ICD-10-CM | POA: Diagnosis not present

## 2023-01-09 DIAGNOSIS — M5416 Radiculopathy, lumbar region: Secondary | ICD-10-CM | POA: Diagnosis not present

## 2023-01-09 DIAGNOSIS — M2569 Stiffness of other specified joint, not elsewhere classified: Secondary | ICD-10-CM | POA: Diagnosis not present

## 2023-01-12 DIAGNOSIS — M6281 Muscle weakness (generalized): Secondary | ICD-10-CM | POA: Diagnosis not present

## 2023-01-12 DIAGNOSIS — M79652 Pain in left thigh: Secondary | ICD-10-CM | POA: Diagnosis not present

## 2023-01-12 DIAGNOSIS — M5416 Radiculopathy, lumbar region: Secondary | ICD-10-CM | POA: Diagnosis not present

## 2023-01-12 DIAGNOSIS — M2569 Stiffness of other specified joint, not elsewhere classified: Secondary | ICD-10-CM | POA: Diagnosis not present

## 2023-01-12 DIAGNOSIS — M79651 Pain in right thigh: Secondary | ICD-10-CM | POA: Diagnosis not present

## 2023-01-12 DIAGNOSIS — R293 Abnormal posture: Secondary | ICD-10-CM | POA: Diagnosis not present

## 2023-01-16 DIAGNOSIS — R293 Abnormal posture: Secondary | ICD-10-CM | POA: Diagnosis not present

## 2023-01-16 DIAGNOSIS — M2569 Stiffness of other specified joint, not elsewhere classified: Secondary | ICD-10-CM | POA: Diagnosis not present

## 2023-01-16 DIAGNOSIS — M6281 Muscle weakness (generalized): Secondary | ICD-10-CM | POA: Diagnosis not present

## 2023-01-16 DIAGNOSIS — M79652 Pain in left thigh: Secondary | ICD-10-CM | POA: Diagnosis not present

## 2023-01-16 DIAGNOSIS — M5416 Radiculopathy, lumbar region: Secondary | ICD-10-CM | POA: Diagnosis not present

## 2023-01-16 DIAGNOSIS — M79651 Pain in right thigh: Secondary | ICD-10-CM | POA: Diagnosis not present

## 2023-01-26 DIAGNOSIS — Z6839 Body mass index (BMI) 39.0-39.9, adult: Secondary | ICD-10-CM | POA: Diagnosis not present

## 2023-01-26 DIAGNOSIS — M4326 Fusion of spine, lumbar region: Secondary | ICD-10-CM | POA: Diagnosis not present

## 2023-01-27 DIAGNOSIS — M79652 Pain in left thigh: Secondary | ICD-10-CM | POA: Diagnosis not present

## 2023-01-27 DIAGNOSIS — M6281 Muscle weakness (generalized): Secondary | ICD-10-CM | POA: Diagnosis not present

## 2023-01-27 DIAGNOSIS — R293 Abnormal posture: Secondary | ICD-10-CM | POA: Diagnosis not present

## 2023-01-27 DIAGNOSIS — M79651 Pain in right thigh: Secondary | ICD-10-CM | POA: Diagnosis not present

## 2023-01-27 DIAGNOSIS — M5416 Radiculopathy, lumbar region: Secondary | ICD-10-CM | POA: Diagnosis not present

## 2023-01-27 DIAGNOSIS — M2569 Stiffness of other specified joint, not elsewhere classified: Secondary | ICD-10-CM | POA: Diagnosis not present

## 2023-02-01 DIAGNOSIS — G5761 Lesion of plantar nerve, right lower limb: Secondary | ICD-10-CM | POA: Diagnosis not present

## 2023-02-01 DIAGNOSIS — G5762 Lesion of plantar nerve, left lower limb: Secondary | ICD-10-CM | POA: Diagnosis not present

## 2023-02-02 DIAGNOSIS — M79652 Pain in left thigh: Secondary | ICD-10-CM | POA: Diagnosis not present

## 2023-02-02 DIAGNOSIS — M5416 Radiculopathy, lumbar region: Secondary | ICD-10-CM | POA: Diagnosis not present

## 2023-02-02 DIAGNOSIS — M2569 Stiffness of other specified joint, not elsewhere classified: Secondary | ICD-10-CM | POA: Diagnosis not present

## 2023-02-02 DIAGNOSIS — M79651 Pain in right thigh: Secondary | ICD-10-CM | POA: Diagnosis not present

## 2023-02-02 DIAGNOSIS — R293 Abnormal posture: Secondary | ICD-10-CM | POA: Diagnosis not present

## 2023-02-02 DIAGNOSIS — M6281 Muscle weakness (generalized): Secondary | ICD-10-CM | POA: Diagnosis not present

## 2023-02-03 DIAGNOSIS — Z79899 Other long term (current) drug therapy: Secondary | ICD-10-CM | POA: Diagnosis not present

## 2023-02-03 DIAGNOSIS — E785 Hyperlipidemia, unspecified: Secondary | ICD-10-CM | POA: Diagnosis not present

## 2023-02-06 DIAGNOSIS — M2569 Stiffness of other specified joint, not elsewhere classified: Secondary | ICD-10-CM | POA: Diagnosis not present

## 2023-02-06 DIAGNOSIS — M79651 Pain in right thigh: Secondary | ICD-10-CM | POA: Diagnosis not present

## 2023-02-06 DIAGNOSIS — M79652 Pain in left thigh: Secondary | ICD-10-CM | POA: Diagnosis not present

## 2023-02-06 DIAGNOSIS — R293 Abnormal posture: Secondary | ICD-10-CM | POA: Diagnosis not present

## 2023-02-06 DIAGNOSIS — M5416 Radiculopathy, lumbar region: Secondary | ICD-10-CM | POA: Diagnosis not present

## 2023-02-06 DIAGNOSIS — M6281 Muscle weakness (generalized): Secondary | ICD-10-CM | POA: Diagnosis not present

## 2023-02-07 DIAGNOSIS — R109 Unspecified abdominal pain: Secondary | ICD-10-CM | POA: Diagnosis not present

## 2023-02-07 DIAGNOSIS — Z6841 Body Mass Index (BMI) 40.0 and over, adult: Secondary | ICD-10-CM | POA: Diagnosis not present

## 2023-02-07 DIAGNOSIS — M5136 Other intervertebral disc degeneration, lumbar region: Secondary | ICD-10-CM | POA: Diagnosis not present

## 2023-02-09 DIAGNOSIS — M2569 Stiffness of other specified joint, not elsewhere classified: Secondary | ICD-10-CM | POA: Diagnosis not present

## 2023-02-09 DIAGNOSIS — M79651 Pain in right thigh: Secondary | ICD-10-CM | POA: Diagnosis not present

## 2023-02-09 DIAGNOSIS — M79652 Pain in left thigh: Secondary | ICD-10-CM | POA: Diagnosis not present

## 2023-02-09 DIAGNOSIS — R293 Abnormal posture: Secondary | ICD-10-CM | POA: Diagnosis not present

## 2023-02-09 DIAGNOSIS — M5416 Radiculopathy, lumbar region: Secondary | ICD-10-CM | POA: Diagnosis not present

## 2023-02-09 DIAGNOSIS — M6281 Muscle weakness (generalized): Secondary | ICD-10-CM | POA: Diagnosis not present

## 2023-02-13 DIAGNOSIS — G5761 Lesion of plantar nerve, right lower limb: Secondary | ICD-10-CM | POA: Diagnosis not present

## 2023-02-13 DIAGNOSIS — B351 Tinea unguium: Secondary | ICD-10-CM | POA: Diagnosis not present

## 2023-02-13 DIAGNOSIS — G5762 Lesion of plantar nerve, left lower limb: Secondary | ICD-10-CM | POA: Diagnosis not present

## 2023-02-14 DIAGNOSIS — M5416 Radiculopathy, lumbar region: Secondary | ICD-10-CM | POA: Diagnosis not present

## 2023-02-14 DIAGNOSIS — R293 Abnormal posture: Secondary | ICD-10-CM | POA: Diagnosis not present

## 2023-02-14 DIAGNOSIS — M79651 Pain in right thigh: Secondary | ICD-10-CM | POA: Diagnosis not present

## 2023-02-14 DIAGNOSIS — M79652 Pain in left thigh: Secondary | ICD-10-CM | POA: Diagnosis not present

## 2023-02-14 DIAGNOSIS — M2569 Stiffness of other specified joint, not elsewhere classified: Secondary | ICD-10-CM | POA: Diagnosis not present

## 2023-02-14 DIAGNOSIS — M6281 Muscle weakness (generalized): Secondary | ICD-10-CM | POA: Diagnosis not present

## 2023-02-20 DIAGNOSIS — M5416 Radiculopathy, lumbar region: Secondary | ICD-10-CM | POA: Diagnosis not present

## 2023-02-20 DIAGNOSIS — M2569 Stiffness of other specified joint, not elsewhere classified: Secondary | ICD-10-CM | POA: Diagnosis not present

## 2023-02-20 DIAGNOSIS — M79651 Pain in right thigh: Secondary | ICD-10-CM | POA: Diagnosis not present

## 2023-02-20 DIAGNOSIS — M6281 Muscle weakness (generalized): Secondary | ICD-10-CM | POA: Diagnosis not present

## 2023-02-20 DIAGNOSIS — M79652 Pain in left thigh: Secondary | ICD-10-CM | POA: Diagnosis not present

## 2023-02-20 DIAGNOSIS — R293 Abnormal posture: Secondary | ICD-10-CM | POA: Diagnosis not present

## 2023-02-21 DIAGNOSIS — B351 Tinea unguium: Secondary | ICD-10-CM | POA: Diagnosis not present

## 2023-02-22 DIAGNOSIS — M47816 Spondylosis without myelopathy or radiculopathy, lumbar region: Secondary | ICD-10-CM | POA: Diagnosis not present

## 2023-02-23 DIAGNOSIS — M79651 Pain in right thigh: Secondary | ICD-10-CM | POA: Diagnosis not present

## 2023-02-23 DIAGNOSIS — M6281 Muscle weakness (generalized): Secondary | ICD-10-CM | POA: Diagnosis not present

## 2023-02-23 DIAGNOSIS — M2569 Stiffness of other specified joint, not elsewhere classified: Secondary | ICD-10-CM | POA: Diagnosis not present

## 2023-02-23 DIAGNOSIS — M79652 Pain in left thigh: Secondary | ICD-10-CM | POA: Diagnosis not present

## 2023-02-23 DIAGNOSIS — R293 Abnormal posture: Secondary | ICD-10-CM | POA: Diagnosis not present

## 2023-02-23 DIAGNOSIS — M5416 Radiculopathy, lumbar region: Secondary | ICD-10-CM | POA: Diagnosis not present

## 2023-03-01 DIAGNOSIS — S81811A Laceration without foreign body, right lower leg, initial encounter: Secondary | ICD-10-CM | POA: Diagnosis not present

## 2023-03-02 DIAGNOSIS — M2569 Stiffness of other specified joint, not elsewhere classified: Secondary | ICD-10-CM | POA: Diagnosis not present

## 2023-03-02 DIAGNOSIS — Z792 Long term (current) use of antibiotics: Secondary | ICD-10-CM | POA: Diagnosis not present

## 2023-03-02 DIAGNOSIS — M5416 Radiculopathy, lumbar region: Secondary | ICD-10-CM | POA: Diagnosis not present

## 2023-03-02 DIAGNOSIS — S81801D Unspecified open wound, right lower leg, subsequent encounter: Secondary | ICD-10-CM | POA: Diagnosis not present

## 2023-03-02 DIAGNOSIS — M79651 Pain in right thigh: Secondary | ICD-10-CM | POA: Diagnosis not present

## 2023-03-02 DIAGNOSIS — M6281 Muscle weakness (generalized): Secondary | ICD-10-CM | POA: Diagnosis not present

## 2023-03-02 DIAGNOSIS — M79652 Pain in left thigh: Secondary | ICD-10-CM | POA: Diagnosis not present

## 2023-03-02 DIAGNOSIS — R293 Abnormal posture: Secondary | ICD-10-CM | POA: Diagnosis not present

## 2023-03-06 DIAGNOSIS — M2569 Stiffness of other specified joint, not elsewhere classified: Secondary | ICD-10-CM | POA: Diagnosis not present

## 2023-03-06 DIAGNOSIS — M79651 Pain in right thigh: Secondary | ICD-10-CM | POA: Diagnosis not present

## 2023-03-06 DIAGNOSIS — M79652 Pain in left thigh: Secondary | ICD-10-CM | POA: Diagnosis not present

## 2023-03-06 DIAGNOSIS — R293 Abnormal posture: Secondary | ICD-10-CM | POA: Diagnosis not present

## 2023-03-06 DIAGNOSIS — M6281 Muscle weakness (generalized): Secondary | ICD-10-CM | POA: Diagnosis not present

## 2023-03-06 DIAGNOSIS — M5416 Radiculopathy, lumbar region: Secondary | ICD-10-CM | POA: Diagnosis not present

## 2023-03-10 DIAGNOSIS — S81811D Laceration without foreign body, right lower leg, subsequent encounter: Secondary | ICD-10-CM | POA: Diagnosis not present

## 2023-03-13 DIAGNOSIS — R293 Abnormal posture: Secondary | ICD-10-CM | POA: Diagnosis not present

## 2023-03-13 DIAGNOSIS — M2569 Stiffness of other specified joint, not elsewhere classified: Secondary | ICD-10-CM | POA: Diagnosis not present

## 2023-03-13 DIAGNOSIS — M79652 Pain in left thigh: Secondary | ICD-10-CM | POA: Diagnosis not present

## 2023-03-13 DIAGNOSIS — M79651 Pain in right thigh: Secondary | ICD-10-CM | POA: Diagnosis not present

## 2023-03-13 DIAGNOSIS — M6281 Muscle weakness (generalized): Secondary | ICD-10-CM | POA: Diagnosis not present

## 2023-03-13 DIAGNOSIS — M5416 Radiculopathy, lumbar region: Secondary | ICD-10-CM | POA: Diagnosis not present

## 2023-03-16 DIAGNOSIS — M79651 Pain in right thigh: Secondary | ICD-10-CM | POA: Diagnosis not present

## 2023-03-16 DIAGNOSIS — M6281 Muscle weakness (generalized): Secondary | ICD-10-CM | POA: Diagnosis not present

## 2023-03-16 DIAGNOSIS — R293 Abnormal posture: Secondary | ICD-10-CM | POA: Diagnosis not present

## 2023-03-16 DIAGNOSIS — M5416 Radiculopathy, lumbar region: Secondary | ICD-10-CM | POA: Diagnosis not present

## 2023-03-16 DIAGNOSIS — M2569 Stiffness of other specified joint, not elsewhere classified: Secondary | ICD-10-CM | POA: Diagnosis not present

## 2023-03-16 DIAGNOSIS — M79652 Pain in left thigh: Secondary | ICD-10-CM | POA: Diagnosis not present

## 2023-03-17 DIAGNOSIS — G5761 Lesion of plantar nerve, right lower limb: Secondary | ICD-10-CM | POA: Diagnosis not present

## 2023-03-17 DIAGNOSIS — B351 Tinea unguium: Secondary | ICD-10-CM | POA: Diagnosis not present

## 2023-03-17 DIAGNOSIS — G5762 Lesion of plantar nerve, left lower limb: Secondary | ICD-10-CM | POA: Diagnosis not present

## 2023-03-17 DIAGNOSIS — I872 Venous insufficiency (chronic) (peripheral): Secondary | ICD-10-CM | POA: Diagnosis not present

## 2023-03-21 DIAGNOSIS — M79652 Pain in left thigh: Secondary | ICD-10-CM | POA: Diagnosis not present

## 2023-03-21 DIAGNOSIS — M79651 Pain in right thigh: Secondary | ICD-10-CM | POA: Diagnosis not present

## 2023-03-21 DIAGNOSIS — M5416 Radiculopathy, lumbar region: Secondary | ICD-10-CM | POA: Diagnosis not present

## 2023-03-21 DIAGNOSIS — M6281 Muscle weakness (generalized): Secondary | ICD-10-CM | POA: Diagnosis not present

## 2023-03-21 DIAGNOSIS — R293 Abnormal posture: Secondary | ICD-10-CM | POA: Diagnosis not present

## 2023-03-21 DIAGNOSIS — M2569 Stiffness of other specified joint, not elsewhere classified: Secondary | ICD-10-CM | POA: Diagnosis not present

## 2023-03-22 DIAGNOSIS — Z6841 Body Mass Index (BMI) 40.0 and over, adult: Secondary | ICD-10-CM | POA: Diagnosis not present

## 2023-03-22 DIAGNOSIS — R600 Localized edema: Secondary | ICD-10-CM | POA: Diagnosis not present

## 2023-03-22 DIAGNOSIS — S81811D Laceration without foreign body, right lower leg, subsequent encounter: Secondary | ICD-10-CM | POA: Diagnosis not present

## 2023-03-28 DIAGNOSIS — M2569 Stiffness of other specified joint, not elsewhere classified: Secondary | ICD-10-CM | POA: Diagnosis not present

## 2023-03-28 DIAGNOSIS — M5416 Radiculopathy, lumbar region: Secondary | ICD-10-CM | POA: Diagnosis not present

## 2023-03-28 DIAGNOSIS — M6281 Muscle weakness (generalized): Secondary | ICD-10-CM | POA: Diagnosis not present

## 2023-03-28 DIAGNOSIS — M79651 Pain in right thigh: Secondary | ICD-10-CM | POA: Diagnosis not present

## 2023-03-28 DIAGNOSIS — R293 Abnormal posture: Secondary | ICD-10-CM | POA: Diagnosis not present

## 2023-03-28 DIAGNOSIS — M79652 Pain in left thigh: Secondary | ICD-10-CM | POA: Diagnosis not present

## 2023-04-04 DIAGNOSIS — R600 Localized edema: Secondary | ICD-10-CM | POA: Diagnosis not present

## 2023-04-04 DIAGNOSIS — S81801D Unspecified open wound, right lower leg, subsequent encounter: Secondary | ICD-10-CM | POA: Diagnosis not present

## 2023-04-04 DIAGNOSIS — M545 Low back pain, unspecified: Secondary | ICD-10-CM | POA: Diagnosis not present

## 2023-04-04 DIAGNOSIS — G8929 Other chronic pain: Secondary | ICD-10-CM | POA: Diagnosis not present

## 2023-04-07 DIAGNOSIS — M5416 Radiculopathy, lumbar region: Secondary | ICD-10-CM | POA: Diagnosis not present

## 2023-04-07 DIAGNOSIS — M6281 Muscle weakness (generalized): Secondary | ICD-10-CM | POA: Diagnosis not present

## 2023-04-07 DIAGNOSIS — M2569 Stiffness of other specified joint, not elsewhere classified: Secondary | ICD-10-CM | POA: Diagnosis not present

## 2023-04-07 DIAGNOSIS — M79651 Pain in right thigh: Secondary | ICD-10-CM | POA: Diagnosis not present

## 2023-04-07 DIAGNOSIS — R293 Abnormal posture: Secondary | ICD-10-CM | POA: Diagnosis not present

## 2023-04-07 DIAGNOSIS — M79652 Pain in left thigh: Secondary | ICD-10-CM | POA: Diagnosis not present

## 2023-04-10 DIAGNOSIS — R293 Abnormal posture: Secondary | ICD-10-CM | POA: Diagnosis not present

## 2023-04-10 DIAGNOSIS — M5416 Radiculopathy, lumbar region: Secondary | ICD-10-CM | POA: Diagnosis not present

## 2023-04-10 DIAGNOSIS — M2569 Stiffness of other specified joint, not elsewhere classified: Secondary | ICD-10-CM | POA: Diagnosis not present

## 2023-04-10 DIAGNOSIS — M6281 Muscle weakness (generalized): Secondary | ICD-10-CM | POA: Diagnosis not present

## 2023-04-10 DIAGNOSIS — M79652 Pain in left thigh: Secondary | ICD-10-CM | POA: Diagnosis not present

## 2023-04-10 DIAGNOSIS — M79651 Pain in right thigh: Secondary | ICD-10-CM | POA: Diagnosis not present

## 2023-04-12 DIAGNOSIS — M79652 Pain in left thigh: Secondary | ICD-10-CM | POA: Diagnosis not present

## 2023-04-12 DIAGNOSIS — M2569 Stiffness of other specified joint, not elsewhere classified: Secondary | ICD-10-CM | POA: Diagnosis not present

## 2023-04-12 DIAGNOSIS — M6281 Muscle weakness (generalized): Secondary | ICD-10-CM | POA: Diagnosis not present

## 2023-04-12 DIAGNOSIS — M79651 Pain in right thigh: Secondary | ICD-10-CM | POA: Diagnosis not present

## 2023-04-12 DIAGNOSIS — M5416 Radiculopathy, lumbar region: Secondary | ICD-10-CM | POA: Diagnosis not present

## 2023-04-12 DIAGNOSIS — R293 Abnormal posture: Secondary | ICD-10-CM | POA: Diagnosis not present

## 2023-04-15 DIAGNOSIS — L03115 Cellulitis of right lower limb: Secondary | ICD-10-CM | POA: Diagnosis not present

## 2023-04-17 DIAGNOSIS — L03115 Cellulitis of right lower limb: Secondary | ICD-10-CM | POA: Diagnosis not present

## 2023-04-22 DIAGNOSIS — L03115 Cellulitis of right lower limb: Secondary | ICD-10-CM | POA: Diagnosis not present

## 2023-04-25 DIAGNOSIS — I872 Venous insufficiency (chronic) (peripheral): Secondary | ICD-10-CM | POA: Diagnosis not present

## 2023-04-25 DIAGNOSIS — L97212 Non-pressure chronic ulcer of right calf with fat layer exposed: Secondary | ICD-10-CM | POA: Diagnosis not present

## 2023-04-25 DIAGNOSIS — R600 Localized edema: Secondary | ICD-10-CM | POA: Diagnosis not present

## 2023-04-25 DIAGNOSIS — L97812 Non-pressure chronic ulcer of other part of right lower leg with fat layer exposed: Secondary | ICD-10-CM | POA: Diagnosis not present

## 2023-04-27 ENCOUNTER — Other Ambulatory Visit: Payer: Self-pay | Admitting: Orthopaedic Surgery

## 2023-04-27 DIAGNOSIS — Z6839 Body mass index (BMI) 39.0-39.9, adult: Secondary | ICD-10-CM | POA: Diagnosis not present

## 2023-04-27 DIAGNOSIS — M4326 Fusion of spine, lumbar region: Secondary | ICD-10-CM

## 2023-04-27 DIAGNOSIS — M4316 Spondylolisthesis, lumbar region: Secondary | ICD-10-CM

## 2023-05-01 DIAGNOSIS — B351 Tinea unguium: Secondary | ICD-10-CM | POA: Diagnosis not present

## 2023-05-01 DIAGNOSIS — G5761 Lesion of plantar nerve, right lower limb: Secondary | ICD-10-CM | POA: Diagnosis not present

## 2023-05-01 DIAGNOSIS — I872 Venous insufficiency (chronic) (peripheral): Secondary | ICD-10-CM | POA: Diagnosis not present

## 2023-05-01 DIAGNOSIS — G5762 Lesion of plantar nerve, left lower limb: Secondary | ICD-10-CM | POA: Diagnosis not present

## 2023-05-02 DIAGNOSIS — L97212 Non-pressure chronic ulcer of right calf with fat layer exposed: Secondary | ICD-10-CM | POA: Diagnosis not present

## 2023-05-02 DIAGNOSIS — I872 Venous insufficiency (chronic) (peripheral): Secondary | ICD-10-CM | POA: Diagnosis not present

## 2023-05-02 DIAGNOSIS — R600 Localized edema: Secondary | ICD-10-CM | POA: Diagnosis not present

## 2023-05-05 DIAGNOSIS — H5203 Hypermetropia, bilateral: Secondary | ICD-10-CM | POA: Diagnosis not present

## 2023-05-09 DIAGNOSIS — R600 Localized edema: Secondary | ICD-10-CM | POA: Diagnosis not present

## 2023-05-09 DIAGNOSIS — I83012 Varicose veins of right lower extremity with ulcer of calf: Secondary | ICD-10-CM | POA: Diagnosis not present

## 2023-05-09 DIAGNOSIS — L97212 Non-pressure chronic ulcer of right calf with fat layer exposed: Secondary | ICD-10-CM | POA: Diagnosis not present

## 2023-05-09 DIAGNOSIS — I872 Venous insufficiency (chronic) (peripheral): Secondary | ICD-10-CM | POA: Diagnosis not present

## 2023-05-16 DIAGNOSIS — R600 Localized edema: Secondary | ICD-10-CM | POA: Diagnosis not present

## 2023-05-16 DIAGNOSIS — L97212 Non-pressure chronic ulcer of right calf with fat layer exposed: Secondary | ICD-10-CM | POA: Diagnosis not present

## 2023-05-16 DIAGNOSIS — I872 Venous insufficiency (chronic) (peripheral): Secondary | ICD-10-CM | POA: Diagnosis not present

## 2023-05-19 ENCOUNTER — Ambulatory Visit
Admission: RE | Admit: 2023-05-19 | Discharge: 2023-05-19 | Disposition: A | Discharge status: Home / Self Care | Payer: Medicare HMO | Source: Ambulatory Visit | Attending: Orthopaedic Surgery | Admitting: Orthopaedic Surgery

## 2023-05-19 DIAGNOSIS — M47816 Spondylosis without myelopathy or radiculopathy, lumbar region: Secondary | ICD-10-CM | POA: Diagnosis not present

## 2023-05-19 DIAGNOSIS — M4326 Fusion of spine, lumbar region: Secondary | ICD-10-CM

## 2023-05-19 DIAGNOSIS — Z981 Arthrodesis status: Secondary | ICD-10-CM | POA: Diagnosis not present

## 2023-05-19 DIAGNOSIS — M4316 Spondylolisthesis, lumbar region: Secondary | ICD-10-CM

## 2023-05-23 DIAGNOSIS — R600 Localized edema: Secondary | ICD-10-CM | POA: Diagnosis not present

## 2023-05-23 DIAGNOSIS — L97212 Non-pressure chronic ulcer of right calf with fat layer exposed: Secondary | ICD-10-CM | POA: Diagnosis not present

## 2023-05-23 DIAGNOSIS — L97812 Non-pressure chronic ulcer of other part of right lower leg with fat layer exposed: Secondary | ICD-10-CM | POA: Diagnosis not present

## 2023-05-23 DIAGNOSIS — I872 Venous insufficiency (chronic) (peripheral): Secondary | ICD-10-CM | POA: Diagnosis not present

## 2023-05-31 ENCOUNTER — Other Ambulatory Visit: Payer: Self-pay | Admitting: Orthopaedic Surgery

## 2023-05-31 DIAGNOSIS — M47816 Spondylosis without myelopathy or radiculopathy, lumbar region: Secondary | ICD-10-CM

## 2023-05-31 DIAGNOSIS — M48062 Spinal stenosis, lumbar region with neurogenic claudication: Secondary | ICD-10-CM

## 2023-05-31 DIAGNOSIS — M65331 Trigger finger, right middle finger: Secondary | ICD-10-CM | POA: Diagnosis not present

## 2023-06-01 DIAGNOSIS — I872 Venous insufficiency (chronic) (peripheral): Secondary | ICD-10-CM | POA: Diagnosis not present

## 2023-06-01 DIAGNOSIS — R600 Localized edema: Secondary | ICD-10-CM | POA: Diagnosis not present

## 2023-06-01 DIAGNOSIS — L97812 Non-pressure chronic ulcer of other part of right lower leg with fat layer exposed: Secondary | ICD-10-CM | POA: Diagnosis not present

## 2023-06-05 ENCOUNTER — Encounter: Payer: Self-pay | Admitting: Orthopaedic Surgery

## 2023-06-15 ENCOUNTER — Other Ambulatory Visit: Payer: Medicare HMO

## 2023-06-28 ENCOUNTER — Other Ambulatory Visit: Payer: Medicare HMO

## 2023-07-17 ENCOUNTER — Ambulatory Visit
Admission: RE | Admit: 2023-07-17 | Discharge: 2023-07-17 | Disposition: A | Discharge status: Home / Self Care | Payer: Medicare (Managed Care) | Source: Ambulatory Visit | Attending: Orthopaedic Surgery | Admitting: Orthopaedic Surgery

## 2023-07-17 DIAGNOSIS — M48062 Spinal stenosis, lumbar region with neurogenic claudication: Secondary | ICD-10-CM

## 2023-07-17 DIAGNOSIS — M47816 Spondylosis without myelopathy or radiculopathy, lumbar region: Secondary | ICD-10-CM

## 2023-07-25 DIAGNOSIS — D509 Iron deficiency anemia, unspecified: Secondary | ICD-10-CM | POA: Diagnosis not present

## 2023-08-07 ENCOUNTER — Other Ambulatory Visit: Payer: Self-pay

## 2023-08-07 ENCOUNTER — Telehealth: Payer: Self-pay

## 2023-08-07 DIAGNOSIS — D509 Iron deficiency anemia, unspecified: Secondary | ICD-10-CM | POA: Insufficient documentation

## 2023-08-07 NOTE — Telephone Encounter (Signed)
 Auth Submission: NO AUTH NEEDED Site of care: Site of care: [facility] Payer: [employer] medicare Medication & CPT/J Code(s) submitted: Venofer (Iron Sucrose) J1756 Route of submission (phone, fax, portal):  Phone # Fax # Auth type: Buy/Bill PB Units/visits requested: 500mg  x 2 doses Reference number:  Approval from: 08/07/23 to 02/07/24

## 2023-08-21 ENCOUNTER — Ambulatory Visit: Payer: Medicare (Managed Care)

## 2023-08-22 ENCOUNTER — Encounter: Payer: Self-pay | Admitting: Nurse Practitioner

## 2023-08-23 ENCOUNTER — Telehealth: Payer: Self-pay

## 2023-08-23 ENCOUNTER — Other Ambulatory Visit: Payer: Self-pay

## 2023-08-23 NOTE — Telephone Encounter (Signed)
 Reached out to patient to gain clarification regarding a MyChart message patient sent.  Patient stated she recently had several back surgeries and she wants to focus on healing and recovery for now, and to cancel her 08/28/23 office visit and lab appt.  Patient stated she recently had IV iron at [facility] and that having to sit for 5hrs+ took a toll on her back. Let patient know that I would cancel her appointments, that I would let Dr. Mosetta Putt and her team know. Let patient know to that she should also notify Dr. Cliffton Asters (the referring provider).  Patient agreed to plan and let me know that sh would contact us to reschedule. Provided patient with our direct contact information so that she could reach [place] if she had any other concerns or questions.

## 2023-08-25 ENCOUNTER — Telehealth: Payer: Self-pay | Admitting: Nurse Practitioner

## 2023-08-28 ENCOUNTER — Other Ambulatory Visit: Payer: Medicare (Managed Care)

## 2023-08-28 ENCOUNTER — Encounter: Payer: Medicare (Managed Care) | Admitting: Nurse Practitioner

## 2023-08-28 DIAGNOSIS — M47816 Spondylosis without myelopathy or radiculopathy, lumbar region: Secondary | ICD-10-CM | POA: Diagnosis not present

## 2023-08-28 DIAGNOSIS — M47814 Spondylosis without myelopathy or radiculopathy, thoracic region: Secondary | ICD-10-CM | POA: Diagnosis not present

## 2023-09-04 ENCOUNTER — Ambulatory Visit: Payer: Medicare (Managed Care)

## 2023-09-18 ENCOUNTER — Ambulatory Visit: Payer: Medicare (Managed Care)

## 2023-09-19 DIAGNOSIS — M4326 Fusion of spine, lumbar region: Secondary | ICD-10-CM | POA: Diagnosis not present

## 2023-09-19 DIAGNOSIS — M47816 Spondylosis without myelopathy or radiculopathy, lumbar region: Secondary | ICD-10-CM | POA: Diagnosis not present

## 2023-09-20 ENCOUNTER — Other Ambulatory Visit: Payer: Self-pay | Admitting: Family Medicine

## 2023-09-20 DIAGNOSIS — Z1231 Encounter for screening mammogram for malignant neoplasm of breast: Secondary | ICD-10-CM

## 2023-09-26 DIAGNOSIS — G5762 Lesion of plantar nerve, left lower limb: Secondary | ICD-10-CM | POA: Diagnosis not present

## 2023-09-26 DIAGNOSIS — Q828 Other specified congenital malformations of skin: Secondary | ICD-10-CM | POA: Diagnosis not present

## 2023-09-26 DIAGNOSIS — L565 Disseminated superficial actinic porokeratosis (DSAP): Secondary | ICD-10-CM | POA: Diagnosis not present

## 2023-09-26 DIAGNOSIS — G5761 Lesion of plantar nerve, right lower limb: Secondary | ICD-10-CM | POA: Diagnosis not present

## 2023-09-26 DIAGNOSIS — B351 Tinea unguium: Secondary | ICD-10-CM | POA: Diagnosis not present

## 2023-09-26 DIAGNOSIS — I872 Venous insufficiency (chronic) (peripheral): Secondary | ICD-10-CM | POA: Diagnosis not present

## 2023-10-08 DIAGNOSIS — B351 Tinea unguium: Secondary | ICD-10-CM | POA: Diagnosis not present

## 2023-10-12 ENCOUNTER — Ambulatory Visit (INDEPENDENT_AMBULATORY_CARE_PROVIDER_SITE_OTHER): Payer: Medicare (Managed Care)

## 2023-10-12 DIAGNOSIS — Z1231 Encounter for screening mammogram for malignant neoplasm of breast: Secondary | ICD-10-CM

## 2023-10-13 DIAGNOSIS — M6281 Muscle weakness (generalized): Secondary | ICD-10-CM | POA: Diagnosis not present

## 2023-10-13 DIAGNOSIS — M5416 Radiculopathy, lumbar region: Secondary | ICD-10-CM | POA: Diagnosis not present

## 2023-10-13 DIAGNOSIS — M79652 Pain in left thigh: Secondary | ICD-10-CM | POA: Diagnosis not present

## 2023-10-13 DIAGNOSIS — M79651 Pain in right thigh: Secondary | ICD-10-CM | POA: Diagnosis not present

## 2023-10-13 DIAGNOSIS — M2569 Stiffness of other specified joint, not elsewhere classified: Secondary | ICD-10-CM | POA: Diagnosis not present

## 2023-10-18 DIAGNOSIS — G5761 Lesion of plantar nerve, right lower limb: Secondary | ICD-10-CM | POA: Diagnosis not present

## 2023-10-18 DIAGNOSIS — B351 Tinea unguium: Secondary | ICD-10-CM | POA: Diagnosis not present

## 2023-10-18 DIAGNOSIS — M79651 Pain in right thigh: Secondary | ICD-10-CM | POA: Diagnosis not present

## 2023-10-18 DIAGNOSIS — G5762 Lesion of plantar nerve, left lower limb: Secondary | ICD-10-CM | POA: Diagnosis not present

## 2023-10-18 DIAGNOSIS — I872 Venous insufficiency (chronic) (peripheral): Secondary | ICD-10-CM | POA: Diagnosis not present

## 2023-10-18 DIAGNOSIS — M79652 Pain in left thigh: Secondary | ICD-10-CM | POA: Diagnosis not present

## 2023-10-18 DIAGNOSIS — M5416 Radiculopathy, lumbar region: Secondary | ICD-10-CM | POA: Diagnosis not present

## 2023-10-18 DIAGNOSIS — M2569 Stiffness of other specified joint, not elsewhere classified: Secondary | ICD-10-CM | POA: Diagnosis not present

## 2023-10-18 DIAGNOSIS — Q828 Other specified congenital malformations of skin: Secondary | ICD-10-CM | POA: Diagnosis not present

## 2023-10-18 DIAGNOSIS — L565 Disseminated superficial actinic porokeratosis (DSAP): Secondary | ICD-10-CM | POA: Diagnosis not present

## 2023-10-23 DIAGNOSIS — M79651 Pain in right thigh: Secondary | ICD-10-CM | POA: Diagnosis not present

## 2023-10-23 DIAGNOSIS — M5416 Radiculopathy, lumbar region: Secondary | ICD-10-CM | POA: Diagnosis not present

## 2023-10-23 DIAGNOSIS — M79652 Pain in left thigh: Secondary | ICD-10-CM | POA: Diagnosis not present

## 2023-10-23 DIAGNOSIS — M2569 Stiffness of other specified joint, not elsewhere classified: Secondary | ICD-10-CM | POA: Diagnosis not present

## 2023-10-27 DIAGNOSIS — M5416 Radiculopathy, lumbar region: Secondary | ICD-10-CM | POA: Diagnosis not present

## 2023-10-27 DIAGNOSIS — M2569 Stiffness of other specified joint, not elsewhere classified: Secondary | ICD-10-CM | POA: Diagnosis not present

## 2023-10-27 DIAGNOSIS — M79652 Pain in left thigh: Secondary | ICD-10-CM | POA: Diagnosis not present

## 2023-10-27 DIAGNOSIS — M79651 Pain in right thigh: Secondary | ICD-10-CM | POA: Diagnosis not present

## 2023-10-31 DIAGNOSIS — H40013 Open angle with borderline findings, low risk, bilateral: Secondary | ICD-10-CM | POA: Diagnosis not present

## 2023-10-31 DIAGNOSIS — H2589 Other age-related cataract: Secondary | ICD-10-CM | POA: Diagnosis not present

## 2023-10-31 DIAGNOSIS — H2513 Age-related nuclear cataract, bilateral: Secondary | ICD-10-CM | POA: Diagnosis not present

## 2023-10-31 DIAGNOSIS — H5703 Miosis: Secondary | ICD-10-CM | POA: Diagnosis not present

## 2023-11-06 DIAGNOSIS — M2569 Stiffness of other specified joint, not elsewhere classified: Secondary | ICD-10-CM | POA: Diagnosis not present

## 2023-11-06 DIAGNOSIS — M79652 Pain in left thigh: Secondary | ICD-10-CM | POA: Diagnosis not present

## 2023-11-06 DIAGNOSIS — M79651 Pain in right thigh: Secondary | ICD-10-CM | POA: Diagnosis not present

## 2023-11-06 DIAGNOSIS — M6281 Muscle weakness (generalized): Secondary | ICD-10-CM | POA: Diagnosis not present

## 2023-11-06 DIAGNOSIS — M5416 Radiculopathy, lumbar region: Secondary | ICD-10-CM | POA: Diagnosis not present

## 2023-11-09 DIAGNOSIS — M5416 Radiculopathy, lumbar region: Secondary | ICD-10-CM | POA: Diagnosis not present

## 2023-11-09 DIAGNOSIS — M79652 Pain in left thigh: Secondary | ICD-10-CM | POA: Diagnosis not present

## 2023-11-09 DIAGNOSIS — M2569 Stiffness of other specified joint, not elsewhere classified: Secondary | ICD-10-CM | POA: Diagnosis not present

## 2023-11-09 DIAGNOSIS — M79651 Pain in right thigh: Secondary | ICD-10-CM | POA: Diagnosis not present

## 2023-11-13 DIAGNOSIS — M5416 Radiculopathy, lumbar region: Secondary | ICD-10-CM | POA: Diagnosis not present

## 2023-11-13 DIAGNOSIS — M2569 Stiffness of other specified joint, not elsewhere classified: Secondary | ICD-10-CM | POA: Diagnosis not present

## 2023-11-13 DIAGNOSIS — M79652 Pain in left thigh: Secondary | ICD-10-CM | POA: Diagnosis not present

## 2023-11-13 DIAGNOSIS — M79651 Pain in right thigh: Secondary | ICD-10-CM | POA: Diagnosis not present

## 2023-11-27 DIAGNOSIS — M51362 Other intervertebral disc degeneration, lumbar region with discogenic back pain and lower extremity pain: Secondary | ICD-10-CM | POA: Diagnosis not present

## 2023-11-27 DIAGNOSIS — Z0000 Encounter for general adult medical examination without abnormal findings: Secondary | ICD-10-CM | POA: Diagnosis not present

## 2023-11-27 DIAGNOSIS — I10 Essential (primary) hypertension: Secondary | ICD-10-CM | POA: Diagnosis not present

## 2023-11-27 DIAGNOSIS — D509 Iron deficiency anemia, unspecified: Secondary | ICD-10-CM | POA: Diagnosis not present

## 2023-11-27 DIAGNOSIS — E785 Hyperlipidemia, unspecified: Secondary | ICD-10-CM | POA: Diagnosis not present

## 2023-11-27 DIAGNOSIS — K219 Gastro-esophageal reflux disease without esophagitis: Secondary | ICD-10-CM | POA: Diagnosis not present

## 2023-11-27 DIAGNOSIS — I4510 Unspecified right bundle-branch block: Secondary | ICD-10-CM | POA: Diagnosis not present

## 2023-11-27 DIAGNOSIS — Z79899 Other long term (current) drug therapy: Secondary | ICD-10-CM | POA: Diagnosis not present

## 2023-11-27 DIAGNOSIS — J309 Allergic rhinitis, unspecified: Secondary | ICD-10-CM | POA: Diagnosis not present

## 2023-11-27 DIAGNOSIS — Z860100 Personal history of colon polyps, unspecified: Secondary | ICD-10-CM | POA: Diagnosis not present

## 2023-11-27 DIAGNOSIS — R0789 Other chest pain: Secondary | ICD-10-CM | POA: Diagnosis not present

## 2023-12-11 DIAGNOSIS — Z981 Arthrodesis status: Secondary | ICD-10-CM | POA: Diagnosis not present

## 2023-12-11 DIAGNOSIS — M5116 Intervertebral disc disorders with radiculopathy, lumbar region: Secondary | ICD-10-CM | POA: Diagnosis not present

## 2023-12-11 DIAGNOSIS — G894 Chronic pain syndrome: Secondary | ICD-10-CM | POA: Diagnosis not present

## 2023-12-13 ENCOUNTER — Encounter: Payer: Self-pay | Admitting: Cardiology

## 2023-12-13 ENCOUNTER — Ambulatory Visit: Payer: Medicare (Managed Care) | Attending: Cardiology | Admitting: Cardiology

## 2023-12-13 ENCOUNTER — Other Ambulatory Visit (HOSPITAL_COMMUNITY): Payer: Self-pay

## 2023-12-13 ENCOUNTER — Encounter: Payer: Self-pay | Admitting: Family Medicine

## 2023-12-13 ENCOUNTER — Ambulatory Visit: Payer: Medicare (Managed Care)

## 2023-12-13 VITALS — BP 158/80 | HR 56 | Ht 66.0 in | Wt 226.8 lb

## 2023-12-13 DIAGNOSIS — I10 Essential (primary) hypertension: Secondary | ICD-10-CM | POA: Insufficient documentation

## 2023-12-13 DIAGNOSIS — R0683 Snoring: Secondary | ICD-10-CM | POA: Diagnosis not present

## 2023-12-13 DIAGNOSIS — I499 Cardiac arrhythmia, unspecified: Secondary | ICD-10-CM | POA: Diagnosis not present

## 2023-12-13 DIAGNOSIS — I491 Atrial premature depolarization: Secondary | ICD-10-CM

## 2023-12-13 DIAGNOSIS — R072 Precordial pain: Secondary | ICD-10-CM

## 2023-12-13 MED ORDER — ROSUVASTATIN CALCIUM 20 MG PO TABS
20.0000 mg | ORAL_TABLET | Freq: Every day | ORAL | 1 refills | Status: AC
Start: 2023-12-13 — End: ?
  Filled 2023-12-13: qty 90, 90d supply, fill #0

## 2023-12-13 NOTE — Progress Notes (Addendum)
 Cardiology Office Note:  .   Date:  12/13/2023  ID:  Nicole Herrera, DOB 12-11-1945, MRN [id number] PCP: Nicole Channel, MD  [facility] Providers Cardiologist:  Nicole Lawrence, MD PCP: Nicole Channel, MD  Chief Complaint  Patient presents with   Atypical Chest Pain   Hyperlipidemia   Hypertension     Nicole Herrera is a 78 y.o. female with hypertension, hyperlipidemia, irregular heartbeat, precordial pain  Discussed the use of AI scribe software for clinical note transcription with the patient, who gave verbal consent to proceed.  History of Present Illness Nicole Herrera is a 78 year old female with hypertension and hyperlipidemia who presents with irregular heartbeat. She was referred by her primary care doctor for evaluation of irregular heartbeat.  She experiences an irregular heartbeat, first identified during a physical exam two weeks ago. She has occasional chest pain described as a heavy sensation on the left side, sometimes radiating down the arm, lasting about an hour, and occurring during activity. No regular shortness of breath is present.  Hypertension is managed with metoprolol 50 mg twice daily, though blood pressure is often high during visits. Hyperlipidemia is managed with rosuvastatin  5 mg. Her diet is high in fruits, vegetables, and whole grains, avoiding red meat.  Family history includes heart disease; her father had heart issues and her brother had rheumatic heart disease.      Vitals:   12/13/23 0858  BP: (!) 158/80  Pulse: (!) 56  SpO2: 98%      Review of Systems  Cardiovascular:  Positive for chest pain and irregular heartbeat. Negative for dyspnea on exertion, leg swelling, palpitations and syncope.  Respiratory:  Positive for snoring.         Studies Reviewed: Nicole Herrera        EKG 12/13/2023: Sinus rhythm with frequent PACs Incomplete right bundle branch block When compared with ECG of 13-Jul-2020 12:14,  rate is slower, PACs new     Labs 11/2023: Chol 202, TG 236, HDL 47, LDL 114 HbA1C NA Hb 11.2 Cr 0.6 TSH 1.2   Physical Exam Vitals and nursing note reviewed.  Constitutional:      General: She is not in acute distress. Neck:     Vascular: No JVD.  Cardiovascular:     Rate and Rhythm: Normal rate and regular rhythm.     Heart sounds: Normal heart sounds. No murmur heard. Pulmonary:     Effort: Pulmonary effort is normal.     Breath sounds: Normal breath sounds. No wheezing or rales.  Musculoskeletal:     Right lower leg: No edema.     Left lower leg: No edema.      VISIT DIAGNOSES:   ICD-10-CM   1. Precordial pain  R07.2 EKG 12-Lead    ECHOCARDIOGRAM COMPLETE    Myocardial Perfusion Imaging    Cardiac Stress Test: Informed Consent Details: Physician/Practitioner Attestation; Transcribe to consent form and obtain patient signature    2. Irregular heart beat  I49.9 ECHOCARDIOGRAM COMPLETE    Myocardial Perfusion Imaging    LONG TERM MONITOR (3-14 DAYS)    3. Primary hypertension  I10 EKG 12-Lead    4. Snoring  R06.83 Ambulatory referral to Sleep Studies       Nicole Herrera is a 78 y.o. female with hypertension, hyperlipidemia, irregular heartbeat, precordial pain  Assessment & Plan Irregular heartbeat: Frequent PVCs noted on resting EKG, no associated symptoms. Recommend echocardiogram and 2-week ZIO monitor. Continue metoprolol tartrate 50 mg  twice daily.  Precordial pain: Left-sided chest pain, not typical of angina.  Ability to walk on treadmill limited due to back pain issues. Recommend Lexiscan nuclear stress test.  Hypertension: Uncontrolled.  Unclear if there is component of whitecoat hypertension. Recommend regular home blood pressure monitoring and reassess at next visit. Continue metoprolol for now.  Given her snoring, recommend sleep study. Blood pressure remains uncontrolled, recommend addition of additional agent in future.  Mixed hyperlipidemia: Discussed diet and  lifestyle modifications.  Unfortunately, often related to back pain issues. LDL 114 on rosuvastatin  5 mg daily. Recommend increasing rosuvastatin  to 20 mg daily.  Future prescriptions can be taken out by Dr. Teresa.  Leg edema: Likely due to venous insufficiency. - Recommend wearing compression stockings during the day. - Advise elevating legs at night.    Informed Consent   Shared Decision Making/Informed Consent The risks [chest pain, shortness of breath, cardiac arrhythmias, dizziness, blood pressure fluctuations, myocardial infarction, stroke/transient ischemic attack, nausea, vomiting, allergic reaction, radiation exposure, metallic taste sensation and life-threatening complications (estimated to be 1 in 10,000)], benefits (risk stratification, diagnosing coronary artery disease, treatment guidance) and alternatives of a nuclear stress test were discussed in detail with [name] and she agrees to proceed.       Meds ordered this encounter  Medications   rosuvastatin  (CRESTOR ) 20 MG tablet    Sig: Take 1 tablet (20 mg total) by mouth daily.    Dispense:  90 tablet    Refill:  1     F/u in 2 months w/APP  Signed, Nicole JINNY Lawrence, MD

## 2023-12-13 NOTE — Progress Notes (Unsigned)
 Applied a 14 day Zio XT monitor to patient in the office ?

## 2023-12-13 NOTE — Patient Instructions (Signed)
 Medication Instructions:   CHANGING rosuvastatin  to 20 mg daily.  *If you need a refill on your cardiac medications before your next appointment, please call your pharmacy*  Testing/Procedures:  1.Your physician has requested that you have an echocardiogram. Echocardiography is a painless test that uses sound waves to create images of your heart. It provides your doctor with information about the size and shape of your heart and how well your heart's chambers and valves are working. This procedure takes approximately one hour. There are no restrictions for this procedure. Please do NOT wear cologne, perfume, aftershave, or lotions (deodorant is allowed). Please arrive 15 minutes prior to your appointment time.  Please note: We ask at that you not bring children with you during ultrasound (echo/ vascular) testing. Due to room size and safety concerns, children are not allowed in the ultrasound rooms during exams. Our front office staff cannot provide observation of children in our lobby area while testing is being conducted. An adult accompanying a patient to their appointment will only be allowed in the ultrasound room at the discretion of the ultrasound technician under special circumstances. We apologize for any inconvenience.   2. Sleep study referral  3. Your physician has requested that you have a lexiscan myoview. For further information please visit [email or web address] Please follow instruction sheet, as given.   4. Your physician has requested that you wear a Zio heart monitor for 14 days. This will be mailed to your home with instructions on how to apply the monitor and how to return it when finished. Please allow 2 weeks after returning the heart monitor before our office calls you with the results.   Follow-Up: At [facility], you and your health needs are our priority.  As part of our continuing mission to provide you with exceptional heart care, our providers are all part  of one team.  This team includes your primary Cardiologist (physician) and Advanced Practice Providers or APPs (Physician Assistants and Nurse Practitioners) who all work together to provide you with the care you need, when you need it.  Your next appointment:   2 month(s)  Provider:   One of our Advanced Practice Providers (APPs): Morse Clause, PA-C  Lamarr Satterfield, NP Miriam Shams, NP  Olivia Pavy, PA-C Josefa Beauvais, NP  Leontine Salen, PA-C Orren Fabry, PA-C  Devers, PA-C Ernest Dick, NP  Damien Braver, NP Jon Hails, PA-C  Waddell Donath, PA-C    Dayna Dunn, PA-C  Scott Weaver, PA-C Lum Louis, NP Katlyn West, NP Callie Goodrich, PA-C  Evan Williams, PA-C Sheng Haley, PA-C  Xika Zhao, NP Kathleen Johnson, PA-C   We recommend signing up for the patient portal called MyChart.  Sign up information is provided on this After Visit Summary.  MyChart is used to connect with patients for Virtual Visits (Telemedicine).  Patients are able to view lab/test results, encounter notes, upcoming appointments, etc.  Non-urgent messages can be sent to your provider as well.   To learn more about what you can do with MyChart, go to [email or web address].   Other Instructions

## 2023-12-15 ENCOUNTER — Telehealth (HOSPITAL_COMMUNITY): Payer: Self-pay

## 2023-12-15 NOTE — Telephone Encounter (Signed)
   Pre-operative Risk Assessment    Patient Name: Nicole Herrera  DOB: 09-04-1945 MRN: [id number]   Date of last office visit: 12/13/2023, Dr. Newman Hashimoto, MD Date of next office visit: 02/13/2024, Rosaline Pavy, PA-C   Request for Surgical Clearance    Procedure:  Cataract Extraction of the left eye  Date of Surgery:  Clearance 12/21/23                                Surgeon: Dr. Octavia Surgeon's Group or Practice Name: [facility]  Phone number: [phone] Fax number: [phone]   Type of Clearance Requested:   - Medical    Type of Anesthesia:  Not Indicated   Additional requests/questions:  patient was just seen in our office, anaesthesia type was not indicated, their office will need to be called back 12/18/23 in the morning to have which is indicated for the surgery.   Nicole Herrera   12/15/2023, 4:34 PM

## 2023-12-15 NOTE — Telephone Encounter (Signed)
 The patient called to see if she needed to be cleared for eye and dental surgery. I advised the patient to call those doctors and ask them what they needed. S.Yves Fodor CCT

## 2023-12-15 NOTE — Telephone Encounter (Signed)
   Patient Name: Nicole Herrera  DOB: January 12, 1946 MRN: [id number]  Primary Cardiologist: [place]   Chart reviewed as part of pre-operative protocol coverage. Cataract extractions are recognized in guidelines as low risk surgeries that do not typically require specific preoperative testing or holding of blood thinner therapy. Therefore, given past medical history and time since last visit, based on ACC/AHA guidelines, [name] would be at acceptable risk for the planned procedure without further cardiovascular testing.   I will route this recommendation to the requesting party via Epic fax function and remove from pre-op pool.  Please call with questions.  Lamarr Satterfield, NP 12/15/2023, 4:42 PM

## 2023-12-18 ENCOUNTER — Telehealth: Payer: Self-pay | Admitting: Cardiology

## 2023-12-18 ENCOUNTER — Other Ambulatory Visit: Payer: Self-pay | Admitting: Cardiology

## 2023-12-18 DIAGNOSIS — I10 Essential (primary) hypertension: Secondary | ICD-10-CM

## 2023-12-18 DIAGNOSIS — I491 Atrial premature depolarization: Secondary | ICD-10-CM

## 2023-12-18 DIAGNOSIS — R0683 Snoring: Secondary | ICD-10-CM

## 2023-12-18 DIAGNOSIS — R072 Precordial pain: Secondary | ICD-10-CM

## 2023-12-18 DIAGNOSIS — I499 Cardiac arrhythmia, unspecified: Secondary | ICD-10-CM

## 2023-12-18 NOTE — Telephone Encounter (Signed)
 Patient reports that her heart monitor fell off overnight. She had it placed on Wednesday 7/23. Advised to go ahead and send the monitor back and we will see what we can do about getting a new one.

## 2023-12-18 NOTE — Telephone Encounter (Signed)
 Pt called in stating her her heart monitor has come off and she is unable to stick it back on. Please advise.

## 2023-12-25 DIAGNOSIS — G5761 Lesion of plantar nerve, right lower limb: Secondary | ICD-10-CM | POA: Diagnosis not present

## 2023-12-25 DIAGNOSIS — G5762 Lesion of plantar nerve, left lower limb: Secondary | ICD-10-CM | POA: Diagnosis not present

## 2023-12-25 DIAGNOSIS — L565 Disseminated superficial actinic porokeratosis (DSAP): Secondary | ICD-10-CM | POA: Diagnosis not present

## 2023-12-25 DIAGNOSIS — Q828 Other specified congenital malformations of skin: Secondary | ICD-10-CM | POA: Diagnosis not present

## 2024-01-08 DIAGNOSIS — M5416 Radiculopathy, lumbar region: Secondary | ICD-10-CM | POA: Diagnosis not present

## 2024-01-08 DIAGNOSIS — G894 Chronic pain syndrome: Secondary | ICD-10-CM | POA: Diagnosis not present

## 2024-01-11 ENCOUNTER — Encounter: Payer: Self-pay | Admitting: Family Medicine

## 2024-01-16 ENCOUNTER — Telehealth (HOSPITAL_COMMUNITY): Payer: Self-pay | Admitting: *Deleted

## 2024-01-17 ENCOUNTER — Telehealth (HOSPITAL_COMMUNITY): Payer: Self-pay | Admitting: *Deleted

## 2024-01-17 NOTE — Telephone Encounter (Signed)
 Spoke to patient as a reminder about her STRESS TEST on 01/19/24 at 8:00.

## 2024-01-18 ENCOUNTER — Other Ambulatory Visit: Payer: Self-pay | Admitting: Cardiology

## 2024-01-18 DIAGNOSIS — R072 Precordial pain: Secondary | ICD-10-CM

## 2024-01-18 DIAGNOSIS — I499 Cardiac arrhythmia, unspecified: Secondary | ICD-10-CM

## 2024-01-19 ENCOUNTER — Ambulatory Visit: Payer: Self-pay | Admitting: Cardiology

## 2024-01-19 ENCOUNTER — Ambulatory Visit (HOSPITAL_COMMUNITY)
Admission: RE | Admit: 2024-01-19 | Discharge: 2024-01-19 | Disposition: A | Discharge status: Home / Self Care | Payer: Medicare (Managed Care) | Source: Ambulatory Visit | Attending: Cardiology | Admitting: Cardiology

## 2024-01-19 ENCOUNTER — Ambulatory Visit (HOSPITAL_BASED_OUTPATIENT_CLINIC_OR_DEPARTMENT_OTHER)
Admission: RE | Admit: 2024-01-19 | Discharge: 2024-01-19 | Disposition: A | Discharge status: Home / Self Care | Payer: Medicare (Managed Care) | Source: Ambulatory Visit | Attending: Cardiology | Admitting: Cardiology

## 2024-01-19 DIAGNOSIS — I491 Atrial premature depolarization: Secondary | ICD-10-CM

## 2024-01-19 DIAGNOSIS — I499 Cardiac arrhythmia, unspecified: Secondary | ICD-10-CM

## 2024-01-19 DIAGNOSIS — R072 Precordial pain: Secondary | ICD-10-CM

## 2024-01-19 LAB — MYOCARDIAL PERFUSION IMAGING
LV dias vol: 106 mL (ref 46–106)
LV sys vol: 47 mL (ref 3.8–5.2)
Nuc Stress EF: 56 %
Peak HR: 75 {beats}/min
Rest HR: 62 {beats}/min
Rest Nuclear Isotope Dose: 10.2 mCi
SDS: 4
SRS: 2
SSS: 5
ST Depression (mm): 0 mm
Stress Nuclear Isotope Dose: 30.4 mCi
TID: 1.14

## 2024-01-19 LAB — ECHOCARDIOGRAM COMPLETE
Area-P 1/2: 4.12 cm2
Height: 66 in
P 1/2 time: 459 ms
S' Lateral: 3 cm
Weight: 3616 [oz_av]

## 2024-01-19 MED ORDER — TECHNETIUM TC 99M TETROFOSMIN IV KIT
30.4000 | PACK | Freq: Once | INTRAVENOUS | Status: AC | PRN
  Administered 2024-01-19: 30.4 via INTRAVENOUS

## 2024-01-19 MED ORDER — REGADENOSON 0.4 MG/5ML IV SOLN
INTRAVENOUS | Status: AC
  Filled 2024-01-19: qty 5

## 2024-01-19 MED ORDER — REGADENOSON 0.4 MG/5ML IV SOLN
0.4000 mg | Freq: Once | INTRAVENOUS | Status: AC
  Administered 2024-01-19: 0.4 mg via INTRAVENOUS

## 2024-01-19 MED ORDER — TECHNETIUM TC 99M TETROFOSMIN IV KIT
10.2000 | PACK | Freq: Once | INTRAVENOUS | Status: AC | PRN
  Administered 2024-01-19: 10.2 via INTRAVENOUS

## 2024-01-19 NOTE — Progress Notes (Signed)
 Normal heart pumping function with moderate stiffening of the heart, along with mild leakiness of the aortic and mitral valve.  Stiffening of the heart likely related to hypertension.  We will continue working on blood pressure control.  This can be further discussed during upcoming office visit with Rosaline Pavy, APP in a few weeks.  Thanks MJP

## 2024-01-19 NOTE — Progress Notes (Signed)
 Please message regarding echocardiogram and stress test result. Few episodes of rapid heartbeat, no malignant arrhythmia noted. Can be discussed further during upcoming office visit with APP.  Thanks MJP

## 2024-01-19 NOTE — Progress Notes (Signed)
 Thank you for your prompt follow-up.  Really appreciated.  Nicole Herrera

## 2024-01-19 NOTE — Progress Notes (Signed)
 I was unclear where the regional wall motion abnormality was.  Regardless, Lyle, there is no evidence of significant heart muscle circulation abnormality.   Thanks MJP

## 2024-01-23 NOTE — Telephone Encounter (Signed)
 Pt returning call

## 2024-01-24 NOTE — Telephone Encounter (Signed)
 Patient returned RN's call regarding results.

## 2024-01-25 NOTE — Telephone Encounter (Signed)
 Pt requesting a c/b in regards to results.

## 2024-02-06 NOTE — Progress Notes (Signed)
 Cardiology Office Note:  .   Date:  02/13/2024  ID:  Nicole Herrera, DOB Aug 27, 1945, MRN [id number] PCP: Teresa Channel, MD  [facility] Providers Cardiologist:  Newman JINNY Lawrence, MD    History of Present Illness: .   Nicole Herrera is a 78 y.o. female  with hypertension and hyperlipidemia  & palpitations.  Was seen recently with irreg HB. Echo normal LVEF, mild LVH G2DD, mild coronary calcifications on NST, no ischemia, monitor 1 sinus pause lasting 3 sec noted at 8:36 AM that is post an atrial run of 6 beats. 27 episodes of atrial tachycardia, fastest and longest at 133 bpm for 7 beats.  Patient here for f/u. Reviewed above tests in detail. BP high today. Getting some extra salt. No regular exercise since back surgery. She isn't motivated. Not feeling palpitations. Feels a pressure on occasion, not exertional and lasts less than 5 min. Doesn't happen often.   ROS:    Studies Reviewed: SABRA    EKG Interpretation Date/Time:  Tuesday February 13 2024 10:07:29 EDT Ventricular Rate:  57 PR Interval:  150 QRS Duration:  106 QT Interval:  434 QTC Calculation: 422 R Axis:   61  Text Interpretation: Sinus bradycardia with marked sinus arrhythmia Incomplete right bundle branch block When compared with ECG of 13-Dec-2023 09:02, No significant change was found Confirmed by Parthenia Klinefelter [id number]) on 02/13/2024 10:13:43 AM    Prior CV Studies: GATED SPECT MYO PERF W/LEXISCAN  STRESS 1D 01/19/2024  Interpretation Summary   The study is normal.   No ST deviation was noted.   There is no evidence of ischemia. There is no evidence of infarction. Defect 1: There is a medium defect with mild reduction in uptake present in the apical to mid anterior and anteroseptal location(s) that is fixed. There is normal wall motion in the defect area. Consistent with artifact caused by breast attenuation.   Left ventricular function is abnormal. There was a single regional abnormality. Nuclear stress EF:  56%. The left ventricular ejection fraction is normal (55-65%). End diastolic cavity size is normal. End systolic cavity size is normal.  Systolic function and wall motion was personally reviewed and normal on echo performed 01/19/24.   Left ventricular function is normal.   CT images were obtained for attenuation correction and were examined for the presence of coronary calcium  when appropriate.   Coronary calcium  was present on the attenuation correction CT images. Mild coronary calcifications were present. Coronary calcifications were present in the left anterior descending artery and right coronary artery distribution(s).   Prior study not available for comparison.   ECHO COMPLETE WO IMAGING ENHANCING AGENT 01/19/2024  Narrative ECHOCARDIOGRAM REPORT    Patient Name:   Nicole Herrera Date of Exam: 01/19/2024 Medical Rec #:  [id number]     Height:       66.0 in Accession #:    [id number]    Weight:       226.8 lb Date of Birth:  03-26-1946    BSA:          2.110 m Patient Age:    77 years      BP:           158/80 mmHg Patient Gender: F             HR:           63 bpm. Exam Location:  [address]  Procedure: 2D Echo, Cardiac Doppler and Color Doppler (Both Spectral and Color  Flow Doppler were utilized during procedure).  Indications:    I49.9 Irregular heart beat  History:        Patient has no prior history of Echocardiogram examinations. Arrythmias:Irregular heart beat, Signs/Symptoms:Precordial pain and Edema; Risk Factors:Hypertension, Dyslipidemia and Obesity.  Sonographer:    Elsie Bohr RDCS Referring Phys: [id number] [facility] J PATWARDHAN  IMPRESSIONS   1. Left ventricular ejection fraction, by estimation, is 55 to 60%. The left ventricle has normal function. The left ventricle has no regional wall motion abnormalities. There is mild left ventricular hypertrophy. Left ventricular diastolic parameters are consistent with Grade II diastolic dysfunction  (pseudonormalization). Elevated left atrial pressure. 2. Right ventricular systolic function is normal. The right ventricular size is normal. 3. Left atrial size was mildly dilated. 4. The mitral valve is normal in structure. Trivial mitral valve regurgitation. 5. The aortic valve is tricuspid. Aortic valve regurgitation is mild. 6. The inferior vena cava is normal in size with greater than 50% respiratory variability, suggesting right atrial pressure of 3 mmHg.  FINDINGS Left Ventricle: Left ventricular ejection fraction, by estimation, is 55 to 60%. The left ventricle has normal function. The left ventricle has no regional wall motion abnormalities. The left ventricular internal cavity size was normal in size. There is mild left ventricular hypertrophy. Left ventricular diastolic parameters are consistent with Grade II diastolic dysfunction (pseudonormalization). Elevated left atrial pressure.  Right Ventricle: The right ventricular size is normal. Right vetricular wall thickness was not assessed. Right ventricular systolic function is normal.  Left Atrium: Left atrial size was mildly dilated.  Right Atrium: Right atrial size was normal in size.  Pericardium: There is no evidence of pericardial effusion.  Mitral Valve: The mitral valve is normal in structure. Trivial mitral valve regurgitation.  Tricuspid Valve: The tricuspid valve is normal in structure. Tricuspid valve regurgitation is trivial.  Aortic Valve: The aortic valve is tricuspid. Aortic valve regurgitation is mild. Aortic regurgitation PHT measures 459 msec.  Pulmonic Valve: The pulmonic valve was normal in structure. Pulmonic valve regurgitation is trivial.  Aorta: The aortic root and ascending aorta are structurally normal, with no evidence of dilitation.  Venous: The inferior vena cava is normal in size with greater than 50% respiratory variability, suggesting right atrial pressure of 3 mmHg.  IAS/Shunts: No atrial  level shunt detected by color flow Doppler.   LEFT VENTRICLE PLAX 2D LVIDd:         4.20 cm   Diastology LVIDs:         3.00 cm   LV e' medial:    6.09 cm/s LV PW:         1.20 cm   LV E/e' medial:  19.6 LV IVS:        1.50 cm   LV e' lateral:   7.87 cm/s LVOT diam:     1.80 cm   LV E/e' lateral: 15.2 LV SV:         68 LV SV Index:   32 LVOT Area:     2.54 cm   RIGHT VENTRICLE             IVC RV S prime:     12.00 cm/s  IVC diam: 1.40 cm TAPSE (M-mode): 2.3 cm RVSP:           36.4 mmHg  LEFT ATRIUM             Index        RIGHT ATRIUM  Index LA diam:        4.00 cm 1.90 cm/m   RA Pressure: 3.00 mmHg LA Vol (A2C):   80.8 ml 38.30 ml/m  RA Area:     10.90 cm LA Vol (A4C):   67.7 ml 32.09 ml/m  RA Volume:   25.40 ml  12.04 ml/m LA Biplane Vol: 75.1 ml 35.60 ml/m AORTIC VALVE LVOT Vmax:   110.00 cm/s LVOT Vmean:  74.760 cm/s LVOT VTI:    0.266 m AI PHT:      459 msec  AORTA Ao Root diam: 3.20 cm Ao Asc diam:  3.40 cm  MITRAL VALVE                TRICUSPID VALVE MV Area (PHT): 4.12 cm     TR Peak grad:   33.4 mmHg MV Decel Time: 184 msec     TR Vmax:        289.00 cm/s MV E velocity: 119.20 cm/s  Estimated RAP:  3.00 mmHg MV A velocity: 101.36 cm/s  RVSP:           36.4 mmHg MV E/A ratio:  1.18 SHUNTS Systemic VTI:  0.27 m Systemic Diam: 1.80 cm  Vina Gull MD Electronically signed by Vina Gull MD Signature Date/Time: 01/19/2024/3:29:12 PM    Final   LONG TERM MONITOR (8-14 DAYS) INTERPRETATION 01/08/2024  Narrative Zio patch monitor 17 days (2 separate monitors) 12/13/2023 - 01/02/2024: Dominant rhythm: Sinus w/bundle branch block/IVCD. HR 34-133 bpm. Avg HR 66 bpm. 1 sinus pause lasting 3 sec noted at 8:36 AM that is post an atrial run of 6 beats. 27 episodes of atrial tachycardia, fastest and longest at 133 bpm for 7 beats. 5% isolated SVE, 1% couplet/triplets. 0 episodes of VT. <1% isolated VE, no  couplet/triplets. No atrial  fibrillation/atrial flutter/VT/high grade AV block. 1 patient triggered event correlated with SVT, VE.       Risk Assessment/Calculations:     HYPERTENSION CONTROL Vitals:   02/13/24 1009 02/13/24 1026  BP: (!) 146/92 (!) 160/80    The patient's blood pressure is elevated above target today.  In order to address the patient's elevated BP: Blood pressure will be monitored at home to determine if medication changes need to be made.; The blood pressure is usually elevated in clinic.  Blood pressures monitored at home have been optimal.; Follow up with primary care provider for management.; Follow up with general cardiology has been recommended.          Physical Exam:   VS:  BP (!) 160/80   Pulse (!) 57   Ht 5' 6 (1.676 m)   Wt 229 lb (103.9 kg)   SpO2 95%   BMI 36.96 kg/m    Orhtostatics: No data found. Wt Readings from Last 3 Encounters:  02/13/24 229 lb (103.9 kg)  01/19/24 226 lb (102.5 kg)  12/13/23 226 lb 12.8 oz (102.9 kg)    GEN: Well nourished, well developed in no acute distress NECK: No JVD; No carotid bruits CARDIAC:  RRR, no murmurs, rubs, gallops RESPIRATORY:  Clear to auscultation without rales, wheezing or rhonchi  ABDOMEN: Soft, non-tender, non-distended EXTREMITIES:  No edema; No deformity   ASSESSMENT AND PLAN: .    Irregular heartbeat: Frequent PVCs noted on resting EKG, no associated symptoms.   2-week ZIO monitor.1 sinus pause lasting 3 sec noted at 8:36 AM that is post an atrial run of 6 beats. 27 episodes of atrial tachycardia, fastest and longest at 133 bpm  for 7 beats. Continue metoprolol tartrate 50 mg twice daily. -no recent palpitations   Precordial pain: Left-sided chest pain, not typical of angina.  -no ischemia on NST   Hypertension: Uncontrolled.  Unclear if there is component of whitecoat hypertension. Recommend regular home blood pressure monitoring and reassess at next visit. Continue metoprolol for now.  Given her snoring,  recommend sleep study but she wants to check her insurance first. Mild LVH G2D on echo   Mixed hyperlipidemia: Discussed diet and lifestyle modifications.  Unfortunately, often related to back pain issues. LDL 114 on rosuvastatin  5 mg daily. Due for lab work by  Dr. Teresa.   Leg edema/chronic diastolic CHF: Likely due to venous insufficiency. - Recommend wearing compression stockings during the day. - Advise elevating legs at night. -2 gm sodium diet  Suspected sleep apnea-recommend sleep study. She wants to check her insurance before agreeing to sleep study.           Dispo: f/u in 2-3 months  Signed, Olivia Pavy, PA-C

## 2024-02-13 ENCOUNTER — Encounter: Payer: Self-pay | Admitting: Physician Assistant

## 2024-02-13 ENCOUNTER — Ambulatory Visit: Payer: Medicare (Managed Care) | Attending: Physician Assistant | Admitting: Physician Assistant

## 2024-02-13 VITALS — BP 160/80 | HR 57 | Ht 66.0 in | Wt 229.0 lb

## 2024-02-13 DIAGNOSIS — I10 Essential (primary) hypertension: Secondary | ICD-10-CM

## 2024-02-13 DIAGNOSIS — I499 Cardiac arrhythmia, unspecified: Secondary | ICD-10-CM | POA: Diagnosis not present

## 2024-02-13 DIAGNOSIS — R072 Precordial pain: Secondary | ICD-10-CM

## 2024-02-13 DIAGNOSIS — R29818 Other symptoms and signs involving the nervous system: Secondary | ICD-10-CM

## 2024-02-13 DIAGNOSIS — I5032 Chronic diastolic (congestive) heart failure: Secondary | ICD-10-CM

## 2024-02-13 DIAGNOSIS — E7849 Other hyperlipidemia: Secondary | ICD-10-CM | POA: Diagnosis not present

## 2024-02-13 NOTE — Patient Instructions (Addendum)
 Medication Instructions:  Your physician recommends that you continue on your current medications as directed. Please refer to the Current Medication list given to you today.  *If you need a refill on your cardiac medications before your next appointment, please call your pharmacy*  Please take your blood pressure daily for 2 weeks and send in a MyChart message. Please include heart rates. (One message at the end of the 2 weeks).   HOW TO TAKE YOUR BLOOD PRESSURE: Rest 5 minutes before taking your blood pressure. Don't smoke or drink caffeinated beverages for at least 30 minutes before. Take your blood pressure before (not after) you eat. Sit comfortably with your back supported and both feet on the floor (don't cross your legs). Elevate your arm to heart level on a table or a desk. Use the proper sized cuff. It should fit smoothly and snugly around your bare upper arm. There should be enough room to slip a fingertip under the cuff. The bottom edge of the cuff should be 1 inch above the crease of the elbow. Ideally, take 3 measurements at one sitting and record the average.   Testing/Procedures: Please check with your insurance about a sleep study - [place]    Follow-Up: At [facility], you and your health needs are our priority.  As part of our continuing mission to provide you with exceptional heart care, our providers are all part of one team.  This team includes your primary Cardiologist (physician) and Advanced Practice Providers or APPs (Physician Assistants and Nurse Practitioners) who all work together to provide you with the care you need, when you need it.  Your next appointment:   2-3 month(s)  Provider:   Newman JINNY Lawrence, MD

## 2024-03-12 ENCOUNTER — Encounter: Payer: Self-pay | Admitting: Cardiology

## 2024-03-12 NOTE — Telephone Encounter (Signed)
 Error

## 2024-03-15 DIAGNOSIS — D509 Iron deficiency anemia, unspecified: Secondary | ICD-10-CM | POA: Diagnosis not present

## 2024-03-27 DIAGNOSIS — D509 Iron deficiency anemia, unspecified: Secondary | ICD-10-CM | POA: Diagnosis not present

## 2024-03-27 DIAGNOSIS — I10 Essential (primary) hypertension: Secondary | ICD-10-CM | POA: Diagnosis not present

## 2024-03-27 DIAGNOSIS — M25562 Pain in left knee: Secondary | ICD-10-CM | POA: Diagnosis not present

## 2024-03-27 DIAGNOSIS — Z79899 Other long term (current) drug therapy: Secondary | ICD-10-CM | POA: Diagnosis not present

## 2024-03-27 DIAGNOSIS — M1712 Unilateral primary osteoarthritis, left knee: Secondary | ICD-10-CM | POA: Diagnosis not present

## 2024-03-27 DIAGNOSIS — K219 Gastro-esophageal reflux disease without esophagitis: Secondary | ICD-10-CM | POA: Diagnosis not present

## 2024-03-27 DIAGNOSIS — E785 Hyperlipidemia, unspecified: Secondary | ICD-10-CM | POA: Diagnosis not present

## 2024-04-24 ENCOUNTER — Other Ambulatory Visit (HOSPITAL_COMMUNITY): Payer: Self-pay

## 2024-04-24 ENCOUNTER — Encounter: Payer: Self-pay | Admitting: Cardiology

## 2024-04-24 ENCOUNTER — Ambulatory Visit: Payer: Medicare (Managed Care) | Attending: Cardiology | Admitting: Cardiology

## 2024-04-24 ENCOUNTER — Other Ambulatory Visit: Payer: Self-pay

## 2024-04-24 VITALS — BP 188/90 | HR 64 | Ht 66.0 in | Wt 226.8 lb

## 2024-04-24 DIAGNOSIS — E782 Mixed hyperlipidemia: Secondary | ICD-10-CM | POA: Diagnosis not present

## 2024-04-24 DIAGNOSIS — I2510 Atherosclerotic heart disease of native coronary artery without angina pectoris: Secondary | ICD-10-CM | POA: Insufficient documentation

## 2024-04-24 DIAGNOSIS — I10 Essential (primary) hypertension: Secondary | ICD-10-CM

## 2024-04-24 DIAGNOSIS — E7849 Other hyperlipidemia: Secondary | ICD-10-CM

## 2024-04-24 MED ORDER — LOSARTAN POTASSIUM-HCTZ 50-12.5 MG PO TABS
1.0000 | ORAL_TABLET | Freq: Every day | ORAL | 3 refills | Status: DC
  Filled 2024-04-24: qty 90, 90d supply, fill #0

## 2024-04-24 MED ORDER — METOPROLOL TARTRATE 50 MG PO TABS
50.0000 mg | ORAL_TABLET | Freq: Two times a day (BID) | ORAL | 1 refills | Status: AC
  Filled 2024-04-24: qty 180, 90d supply, fill #0

## 2024-04-24 NOTE — Progress Notes (Signed)
 Cardiology Office Note:  .   Date:  04/24/2024  ID:  Nicole Herrera, DOB 19-Sep-1945, MRN [id number] PCP: Teresa Channel, MD  [facility] Providers Cardiologist:  Newman Lawrence, MD PCP: Teresa Channel, MD  Chief Complaint  Patient presents with   Hypertension     Nicole Herrera is a 78 y.o. female with hypertension, hyperlipidemia, coronary calcification  Discussed the use of AI scribe software for clinical note transcription with the patient, who gave verbal consent to proceed.  History of Present Illness Nicole Herrera is a 78 year old female with hypertension and atrial tachycardia who presents for follow-up of uncontrolled blood pressure and medication management. She was referred by Dr. Teresa for management of her blood pressure.  Over the last few months her blood pressure has remained elevated, with higher readings in August through October compared to July. She is on metoprolol  tartrate 50 mg twice daily with heart rates around 60-63 bpm. She has no chest pain or shortness of breath. Her most recent blood pressure was still elevated but below 180.  Chronic back pain after prior back surgery limits her ability to exercise. She is exploring a physical therapy program and took pain medication this morning with some relief.  For cholesterol, she increased rosuvastatin  from 5 mg to 10 mg daily by taking two 5 mg tablets but has not started the prescribed 20 mg dose.      Vitals:   04/24/24 1059  BP: (!) 188/90  Pulse: 64  SpO2: 98%      Review of Systems  Cardiovascular:  Negative for chest pain, dyspnea on exertion, leg swelling, palpitations and syncope.  Musculoskeletal:  Positive for back pain.        Studies Reviewed: .        Echocardiogram 12/2023: 1. Left ventricular ejection fraction, by estimation, is 55 to 60%. The  left ventricle has normal function. The left ventricle has no regional  wall motion abnormalities. There is mild left  ventricular hypertrophy.  Left ventricular diastolic parameters  are consistent with Grade II diastolic dysfunction (pseudonormalization).  Elevated left atrial pressure.   2. Right ventricular systolic function is normal. The right ventricular  size is normal.   3. Left atrial size was mildly dilated.   4. The mitral valve is normal in structure. Trivial mitral valve  regurgitation.   5. The aortic valve is tricuspid. Aortic valve regurgitation is mild.   6. The inferior vena cava is normal in size with greater than 50%  respiratory variability, suggesting right atrial pressure of 3 mmHg.   Stress test 12/2023:   The study is normal.   No ST deviation was noted.   There is no evidence of ischemia. There is no evidence of infarction. Defect 1: There is a medium defect with mild reduction in uptake present in the apical to mid anterior and anteroseptal location(s) that is fixed. There is normal wall motion in the defect area. Consistent with artifact caused by breast attenuation.   Left ventricular function is abnormal. There was a single regional abnormality. Nuclear stress EF: 56%. The left ventricular ejection fraction is normal (55-65%). End diastolic cavity size is normal. End systolic cavity size is normal.  Systolic function and wall motion was personally reviewed and normal on echo performed 01/19/24.   Left ventricular function is normal.   CT images were obtained for attenuation correction and were examined for the presence of coronary calcium  when appropriate.   Coronary calcium  was  present on the attenuation correction CT images. Mild coronary calcifications were present. Coronary calcifications were present in the left anterior descending artery and right coronary artery distribution(s).   Prior study not available for comparison.  Zio patch monitor 17 days (2 separate monitors) 12/13/2023 - 01/02/2024: Dominant rhythm: Sinus w/bundle branch block/IVCD. HR 34-133 bpm. Avg HR 66  bpm. 1 sinus pause lasting 3 sec noted at 8:36 AM that is post an atrial run of 6 beats. 27 episodes of atrial tachycardia, fastest and longest at 133 bpm for 7 beats. 5% isolated SVE, 1% couplet/triplets. 0 episodes of VT. <1% isolated VE, no  couplet/triplets. No atrial fibrillation/atrial flutter/VT/high grade AV block. 1 patient triggered event correlated with SVT, VE.    Labs 11/2023: Chol 202, TG 236, HDL 47, LDL 114 Hb 10.3 Cr 0.6 TSH 1.2   Physical Exam Vitals and nursing note reviewed.  Constitutional:      General: She is not in acute distress. Neck:     Vascular: No JVD.  Cardiovascular:     Rate and Rhythm: Normal rate and regular rhythm.     Heart sounds: Normal heart sounds. No murmur heard. Pulmonary:     Effort: Pulmonary effort is normal.     Breath sounds: Normal breath sounds. No wheezing or rales.  Musculoskeletal:     Right lower leg: No edema.     Left lower leg: No edema.      VISIT DIAGNOSES:   ICD-10-CM   1. Primary hypertension  I10 Basic metabolic panel with GFR    2. Mixed hyperlipidemia  E78.2 Lipid panel    3. Coronary artery calcification  I25.10        Nicole Herrera is a 78 y.o. female with  hypertension, hyperlipidemia, coronary calcification Assessment & Plan Uncontrolled hypertension: While back pain could be contributing, hypertension remains uncontrolled on metoprolol .  - Continue metoprolol  tartrate 50 mg twice daily.  I do not think increasing metoprolol  to 100 mg twice daily will add much more in terms of blood pressure reduction. - Instead, add losartan  hydrochlorothiazide  50/12.5 mg once daily. -She has had reported allergy with valsartan, but unclear if it was truly related.  He does not recollect having anaphylaxis. Check BMP today, and repeat with PCP in 1 week at next visit. Encouraged hydration.    Mixed hyperlipidemia: Elevated LDL.  Goal <70 plan coronary calcification noted on recent stress test. Currently  only taking Crestor  10 mg daily. Encouraged to start taking Crestor  20 mg daily. Cholesterol not controlled on current rosuvastatin  dose. - Increase rosuvastatin  to 20 mg daily. - Repeat lipid panel in three months.     Meds ordered this encounter  Medications   metoprolol  tartrate (LOPRESSOR ) 50 MG tablet    Sig: Take 1 tablet (50 mg total) by mouth 2 (two) times daily.    Dispense:  180 tablet    Refill:  1   losartan -hydrochlorothiazide  (HYZAAR ) 50-12.5 MG tablet    Sig: Take 1 tablet by mouth daily.    Dispense:  90 tablet    Refill:  3     F/u in 3 months  Signed, Newman JINNY Lawrence, MD

## 2024-04-24 NOTE — Patient Instructions (Addendum)
 Medication Instructions:  START Losartan-hydrochlorothiazide 50-12.5 MG daily  *If you need a refill on your cardiac medications before your next appointment, please call your pharmacy*  Lab Work: BMP TODAY  LIPID PANEL IN 3 MONTHS   If you have labs (blood work) drawn today and your tests are completely normal, you will receive your results only by: MyChart Message (if you have MyChart) OR A paper copy in the mail If you have any lab test that is abnormal or we need to change your treatment, we will call you to review the results.   Follow-Up: At [facility], you and your health needs are our priority.  As part of our continuing mission to provide you with exceptional heart care, our providers are all part of one team.  This team includes your primary Cardiologist (physician) and Advanced Practice Providers or APPs (Physician Assistants and Nurse Practitioners) who all work together to provide you with the care you need, when you need it.  Your next appointment:   3 month(s)  Provider:   One of our Advanced Practice Providers (APPs): Morse Clause, PA-C  Lamarr Satterfield, NP Miriam Shams, NP  Olivia Pavy, PA-C Josefa Beauvais, NP  Leontine Salen, PA-C Orren Fabry, PA-C  [place], PA-C Ernest Dick, NP  Damien Braver, NP Jon Hails, PA-C  Waddell Donath, PA-C    Dayna Dunn, PA-C  Scott Weaver, PA-C Lum Louis, NP Katlyn West, NP Callie Goodrich, PA-C  Xika Zhao, NP Sheng Haley, PA-C    Kathleen Johnson, PA-C

## 2024-04-25 ENCOUNTER — Ambulatory Visit: Payer: Self-pay | Admitting: Cardiology

## 2024-04-25 LAB — BASIC METABOLIC PANEL WITH GFR
BUN/Creatinine Ratio: 15 (ref 12–28)
BUN: 12 mg/dL (ref 8–27)
CO2: 24 mmol/L (ref 20–29)
Calcium: 9.3 mg/dL (ref 8.7–10.3)
Chloride: 97 mmol/L (ref 96–106)
Creatinine, Ser: 0.82 mg/dL (ref 0.57–1.00)
Glucose: 94 mg/dL (ref 70–99)
Potassium: 4.3 mmol/L (ref 3.5–5.2)
Sodium: 133 mmol/L — ABNORMAL LOW (ref 134–144)
eGFR: 74 mL/min/1.73 (ref >59)

## 2024-04-25 NOTE — Progress Notes (Signed)
 Mildly low sodium, similar to that noted in the past. Will need to repeat BMP in 1 week w/Dr. Teresa to ensure Cr and Na/K stay stable.  Thanks MJP

## 2024-05-02 DIAGNOSIS — D509 Iron deficiency anemia, unspecified: Secondary | ICD-10-CM | POA: Diagnosis not present

## 2024-05-02 DIAGNOSIS — Z79899 Other long term (current) drug therapy: Secondary | ICD-10-CM | POA: Diagnosis not present

## 2024-05-02 DIAGNOSIS — E785 Hyperlipidemia, unspecified: Secondary | ICD-10-CM | POA: Diagnosis not present

## 2024-05-08 DIAGNOSIS — E871 Hypo-osmolality and hyponatremia: Secondary | ICD-10-CM | POA: Diagnosis not present

## 2024-05-08 LAB — LAB REPORT - SCANNED: EGFR: 82

## 2024-05-10 ENCOUNTER — Other Ambulatory Visit: Payer: Self-pay

## 2024-05-10 ENCOUNTER — Encounter (HOSPITAL_COMMUNITY): Payer: Self-pay

## 2024-05-10 ENCOUNTER — Emergency Department (HOSPITAL_COMMUNITY)
Admission: EM | Admit: 2024-05-10 | Discharge: 2024-05-10 | Disposition: A | Discharge status: Home / Self Care | Payer: Medicare (Managed Care) | Source: Ambulatory Visit | Attending: Emergency Medicine | Admitting: Emergency Medicine

## 2024-05-10 ENCOUNTER — Emergency Department (HOSPITAL_COMMUNITY): Payer: Medicare (Managed Care)

## 2024-05-10 DIAGNOSIS — R42 Dizziness and giddiness: Secondary | ICD-10-CM | POA: Diagnosis not present

## 2024-05-10 DIAGNOSIS — E871 Hypo-osmolality and hyponatremia: Secondary | ICD-10-CM | POA: Diagnosis not present

## 2024-05-10 DIAGNOSIS — Z79899 Other long term (current) drug therapy: Secondary | ICD-10-CM | POA: Insufficient documentation

## 2024-05-10 DIAGNOSIS — I10 Essential (primary) hypertension: Secondary | ICD-10-CM | POA: Diagnosis not present

## 2024-05-10 LAB — CBC
HCT: 33.2 % — ABNORMAL LOW (ref 36.0–46.0)
Hemoglobin: 11 g/dL — ABNORMAL LOW (ref 12.0–15.0)
MCH: 26.4 pg (ref 26.0–34.0)
MCHC: 33.1 g/dL (ref 30.0–36.0)
MCV: 79.6 fL — ABNORMAL LOW (ref 80.0–100.0)
Platelets: 281 K/uL (ref 150–400)
RBC: 4.17 MIL/uL (ref 3.87–5.11)
RDW: 14 % (ref 11.5–15.5)
WBC: 4.6 K/uL (ref 4.0–10.5)
nRBC: 0 % (ref 0.0–0.2)

## 2024-05-10 LAB — BASIC METABOLIC PANEL WITH GFR
Anion gap: 10 (ref 5–15)
BUN: 13 mg/dL (ref 8–23)
CO2: 26 mmol/L (ref 22–32)
Calcium: 9.1 mg/dL (ref 8.9–10.3)
Chloride: 87 mmol/L — ABNORMAL LOW (ref 98–111)
Creatinine, Ser: 0.7 mg/dL (ref 0.44–1.00)
GFR, Estimated: >60 mL/min
Glucose, Bld: 96 mg/dL (ref 70–99)
Potassium: 3.5 mmol/L (ref 3.5–5.1)
Sodium: 123 mmol/L — ABNORMAL LOW (ref 135–145)

## 2024-05-10 MED ORDER — LOSARTAN POTASSIUM 50 MG PO TABS: 50.0000 mg | ORAL_TABLET | Freq: Every day | ORAL | 0 refills | Status: AC

## 2024-05-10 MED ORDER — SODIUM CHLORIDE 0.9 % IV BOLUS
500.0000 mL | Freq: Once | INTRAVENOUS | Status: AC
  Administered 2024-05-10: 500 mL via INTRAVENOUS

## 2024-05-10 NOTE — ED Triage Notes (Addendum)
 Patient had labs drawn today and was told her sodium is high. Has been dizzy, off balanced, headache, legs feel weak. She thinks it was 130 today when her blood was drawn. Feels more confused than normal.

## 2024-05-10 NOTE — ED Provider Notes (Signed)
 " [facility] EMERGENC[facility] DEPARTMENT AT [facility] Provider Note   CSN: [id number] Arrival date & time: 05/10/24  1530     Patient presents with: Abnormal Lab   Nicole Herrera is a 78 y.o. female.   Patient is a 78 year old female with a past medical history of hypertension presenting to the emergency department with abnormal labs.  Patient states that she saw her cardiologist about a week ago for her hypertension and was started on losartan  and hydrochlorothiazide .  She states that on Monday she started to feel lightheaded and dizzy and weak.  She states that she feels like her legs are going to give out on her.  She states that she saw her primary doctor who ordered labs and told her that her sodium was low and recommended that she stop her hydrochlorothiazide .  She was recommended that she could wait to see if her symptoms improved with stopping the hydrochlorothiazide  or could be evaluated in the ED.  Patient denies any associated numbness or weakness.  Denies any vomiting or diarrhea.  The history is provided by the patient.  Abnormal Lab      Prior to Admission medications  Medication Sig Start Date End Date Taking? Authorizing Provider  losartan  (COZAAR ) 50 MG tablet Take 1 tablet (50 mg total) by mouth daily. 05/10/24  Yes Kingsley, Denesha Brouse K, DO  fluticasone (FLONASE) 50 MCG/ACT nasal spray as needed. 01/25/14   [provider]  furosemide (LASIX) 20 MG tablet Take by mouth.    [provider]  loratadine (CLARITIN) 10 MG tablet Take by mouth as needed.    [provider]  methocarbamol  (ROBAXIN ) 500 MG tablet Take 500 mg by mouth 4 (four) times daily. Unsure on mg    [provider]  metoprolol  tartrate (LOPRESSOR ) 50 MG tablet Take 1 tablet (50 mg total) by mouth 2 (two) times daily. 04/24/24   Patwardhan, Newman PARAS, MD  pantoprazole (PROTONIX) 40 MG tablet as needed. 10/12/14   [provider]  rosuvastatin  (CRESTOR ) 20 MG  tablet Take 1 tablet (20 mg total) by mouth daily. 12/13/23   Patwardhan, Newman PARAS, MD  traMADol (ULTRAM) 50 MG tablet as needed. 04/11/16   [provider]    Allergies: Diphenhydramine, Morphine, Amlodipine besylate, Coreg [carvedilol], Hemocyte plus, Mucinex [guaifenesin er], Nu-iron [polysaccharide iron complex], Triamterene-hctz, and Valsartan    Review of Systems  Updated Vital Signs BP (!) 168/97   Pulse 62   Temp 98.9 F (37.2 C) (Oral)   Resp 16   Ht 5' 6 (1.676 m)   Wt 99.8 kg   SpO2 100%   BMI 35.51 kg/m   Physical Exam Vitals and nursing note reviewed.  Constitutional:      General: She is not in acute distress.    Appearance: Normal appearance.  HENT:     Head: Normocephalic and atraumatic.     Nose: Nose normal.     Mouth/Throat:     Mouth: Mucous membranes are moist.     Pharynx: Oropharynx is clear.  Eyes:     Extraocular Movements: Extraocular movements intact.     Conjunctiva/sclera: Conjunctivae normal.     Pupils: Pupils are equal, round, and reactive to light.  Cardiovascular:     Rate and Rhythm: Normal rate and regular rhythm.     Heart sounds: Normal heart sounds.  Pulmonary:     Effort: Pulmonary effort is normal.     Breath sounds: Normal breath sounds.  Abdominal:  General: Abdomen is flat.     Palpations: Abdomen is soft.     Tenderness: There is no abdominal tenderness.  Musculoskeletal:        General: Normal range of motion.     Cervical back: Normal range of motion.  Skin:    General: Skin is warm and dry.  Neurological:     General: No focal deficit present.     Mental Status: She is alert and oriented to person, place, and time.     Cranial Nerves: No cranial nerve deficit.     Sensory: No sensory deficit.     Motor: No weakness.     Coordination: Coordination normal.  Psychiatric:        Mood and Affect: Mood normal.        Behavior: Behavior normal.     (all labs ordered are listed, but only abnormal  results are displayed) Labs Reviewed  CBC - Abnormal; Notable for the following components:      Result Value   Hemoglobin 11.0 (*)    HCT 33.2 (*)    MCV 79.6 (*)    All other components within normal limits  BASIC METABOLIC PANEL WITH GFR - Abnormal; Notable for the following components:   Sodium 123 (*)    Chloride 87 (*)    All other components within normal limits    EKG: EKG Interpretation Date/Time:  Friday May 10 2024 15:58:21 EST Ventricular Rate:  62 PR Interval:  157 QRS Duration:  116 QT Interval:  444 QTC Calculation: 451 R Axis:   71  Text Interpretation: Sinus rhythm with sinus arrhythmia Nonspecific intraventricular conduction delay No significant change since last tracing Confirmed by Ellouise Fine ([id number]) on 05/10/2024 4:12:28 PM  Radiology: DG Chest 2 View Result Date: 05/10/2024 CLINICAL DATA:  Dizziness with headache. EXAM: DG CHEST 2V COMPARISON:  June 22, 2023 FINDINGS: The heart size and mediastinal contours are within normal limits. Both lungs are clear. The visualized skeletal structures are unremarkable. IMPRESSION: No active cardiopulmonary disease. Electronically Signed   By: Suzen Dials M.D.   On: 05/10/2024 17:55     Procedures   Medications Ordered in the ED  sodium chloride  0.9 % bolus 500 mL (0 mLs Intravenous Stopped 05/10/24 1920)    Clinical Course as of 05/10/24 1940  Fri May 10, 2024  1839 Orthostatics negative. Na 123, likely from hydrochlorothiazide . Will be given saline bolus here and ambulatory trial. Recommended to discontinue hydrochlorothiazide . [VK]  1937 Patient reports improvement of symptoms and is able to ambulate steadily. She is stable for discharge with outpatient follow up for lab recheck.  [VK]    Clinical Course User Index [VK] Kingsley, Lynard Postlewait K, DO                                 Medical Decision Making This patient presents to the ED with chief complaint(s) of dizziness, abnormal labs with  pertinent past medical history of hypertension which further complicates the presenting complaint. The complaint involves an extensive differential diagnosis and also carries with it a high risk of complications and morbidity.    The differential diagnosis includes medication induced hyponatremia, dehydration, electrolyte abnormality, arrhythmia, anemia, no focal neurologic deficits making CVA unlikely, orthostatic hypotension  Additional history obtained: Additional history obtained from friend Records reviewed outpatient cardiology records  ED Course and Reassessment: Patient's arrival she is hemodynamically stable in no acute distress without any  focal neurologic deficits.  EKG showed sinus arrhythmia without significant change from prior.  We are unable to view her outpatient labs and will have repeat labs performed here.  Will of orthostatics and will be closely reassessed.  Independent labs interpretation:  The following labs were independently interpreted: hyponatremia, otherwise at baseline  Independent visualization of imaging: - I independently visualized the following imaging with scope of interpretation limited to determining acute life threatening conditions related to emergency care: CXR, which revealed no acute disease  Consultation: - Consulted or discussed management/test interpretation w/ external professional: N/A  Consideration for admission or further workup: Patient has no emergent conditions requiring admission or further work-up at this time and is stable for discharge home with primary care follow-up  Social Determinants of health: N/A    Amount and/or Complexity of Data Reviewed Labs: ordered. Radiology: ordered.  Risk Prescription drug management.       Final diagnoses:  Hyponatremia    ED Discharge Orders          Ordered    losartan  (COZAAR ) 50 MG tablet  Daily        05/10/24 1938               Kingsley, Ruhi Kopke K, DO 05/10/24  1940  "

## 2024-05-10 NOTE — Discharge Instructions (Signed)
 You were seen in the emergency department for your dizziness.  Your sodium was low here at 123.  We did give you some sodium containing fluids in the emergency department and you should stop your hydrochlorothiazide  and that should help your sodium returned back to normal.  I have given you a prescription for blood pressure medication that just contains the losartan .  You can take this as prescribed.  You should follow-up with your primary doctor within the next week to have your labs and symptoms rechecked.  You should return to the emergency department if you are having worsening dizziness or weakness and cannot walk, you become confused, you have a seizure or any other new or concerning symptoms.

## 2024-05-10 NOTE — ED Notes (Signed)
 Patient transported to x-ray. ?

## 2024-05-14 ENCOUNTER — Ambulatory Visit: Payer: Self-pay | Admitting: Cardiology

## 2024-05-15 DIAGNOSIS — G894 Chronic pain syndrome: Secondary | ICD-10-CM | POA: Diagnosis not present

## 2024-05-15 DIAGNOSIS — M5416 Radiculopathy, lumbar region: Secondary | ICD-10-CM | POA: Diagnosis not present

## 2024-05-15 DIAGNOSIS — M791 Myalgia, unspecified site: Secondary | ICD-10-CM | POA: Diagnosis not present

## 2024-05-15 DIAGNOSIS — Z981 Arthrodesis status: Secondary | ICD-10-CM | POA: Diagnosis not present

## 2024-05-17 DIAGNOSIS — R0789 Other chest pain: Secondary | ICD-10-CM | POA: Diagnosis not present

## 2024-05-17 DIAGNOSIS — I10 Essential (primary) hypertension: Secondary | ICD-10-CM | POA: Diagnosis not present

## 2024-05-17 DIAGNOSIS — E871 Hypo-osmolality and hyponatremia: Secondary | ICD-10-CM | POA: Diagnosis not present

## 2024-05-17 DIAGNOSIS — R001 Bradycardia, unspecified: Secondary | ICD-10-CM | POA: Diagnosis not present

## 2024-05-22 DIAGNOSIS — E559 Vitamin D deficiency, unspecified: Secondary | ICD-10-CM | POA: Diagnosis not present

## 2024-05-22 DIAGNOSIS — R42 Dizziness and giddiness: Secondary | ICD-10-CM | POA: Diagnosis not present

## 2024-06-04 ENCOUNTER — Other Ambulatory Visit (HOSPITAL_COMMUNITY): Payer: Self-pay | Admitting: Family Medicine

## 2024-06-04 NOTE — Progress Notes (Signed)
 Received Feraheme 510mg  x 2 referral from [place] at [facility].  Referral requested for [employer].  Patient previously unable to tolerate PO iron  Sherry Pennant, PharmD, MPH, BCPS, CPP Clinical Pharmacist

## 2024-06-05 ENCOUNTER — Telehealth: Payer: Self-pay | Admitting: Pharmacy Technician

## 2024-06-05 NOTE — Telephone Encounter (Signed)
" °  Devki, the patient will be scheduled as soon as possible.   Auth Submission: PENDING Site of care: Site of care: [facility] Payer: [employer] Medicare Medication & CPT/J Code(s) submitted: Feraheme (ferumoxytol) [id number] Diagnosis Code: D50.9 Route of submission (phone, fax, portal): via Latent portal Phone # [phone] / [phone] Fax # [phone] Auth type: Buy/Bill PB Units/visits requested: 510 mg x 2 Reference number: [id number] Approval from: 06/07/2024 to 08/21/2024             "

## 2024-06-14 ENCOUNTER — Encounter: Payer: Self-pay | Admitting: Family Medicine

## 2024-07-25 ENCOUNTER — Ambulatory Visit: Payer: Medicare (Managed Care) | Admitting: General Practice
